# Patient Record
Sex: Female | Born: 1980 | Race: Black or African American | Hispanic: No | Marital: Single | Smoking: Never smoker
Health system: Southern US, Community
[De-identification: ages and names within clinical notes are randomized; demographics above are authoritative.]

## PROBLEM LIST (undated history)

## (undated) DIAGNOSIS — R87629 Unspecified abnormal cytological findings in specimens from vagina: Secondary | ICD-10-CM

## (undated) DIAGNOSIS — A6000 Herpesviral infection of urogenital system, unspecified: Secondary | ICD-10-CM

## (undated) DIAGNOSIS — N912 Amenorrhea, unspecified: Secondary | ICD-10-CM

## (undated) DIAGNOSIS — Z8619 Personal history of other infectious and parasitic diseases: Secondary | ICD-10-CM

## (undated) DIAGNOSIS — R7303 Prediabetes: Secondary | ICD-10-CM

## (undated) DIAGNOSIS — E7800 Pure hypercholesterolemia, unspecified: Secondary | ICD-10-CM

## (undated) HISTORY — DX: Personal history of other infectious and parasitic diseases: Z86.19

## (undated) HISTORY — DX: Herpesviral infection of urogenital system, unspecified: A60.00

## (undated) HISTORY — DX: Unspecified abnormal cytological findings in specimens from vagina: R87.629

## (undated) HISTORY — DX: Amenorrhea, unspecified: N91.2

## (undated) HISTORY — DX: Pure hypercholesterolemia, unspecified: E78.00

## (undated) HISTORY — DX: Prediabetes: R73.03

---

## 2005-05-21 ENCOUNTER — Emergency Department: Payer: Self-pay | Admitting: General Practice

## 2005-05-24 ENCOUNTER — Emergency Department: Payer: Self-pay | Admitting: Emergency Medicine

## 2005-06-13 ENCOUNTER — Emergency Department: Payer: Self-pay | Admitting: Emergency Medicine

## 2006-04-27 ENCOUNTER — Emergency Department: Payer: Self-pay | Admitting: Emergency Medicine

## 2006-05-05 ENCOUNTER — Ambulatory Visit: Payer: Self-pay | Admitting: Family Medicine

## 2006-06-20 ENCOUNTER — Emergency Department: Payer: Self-pay | Admitting: Emergency Medicine

## 2006-08-04 HISTORY — PX: TONSILLECTOMY AND ADENOIDECTOMY: SHX28

## 2006-08-18 ENCOUNTER — Ambulatory Visit: Payer: Self-pay | Admitting: Otolaryngology

## 2006-10-17 ENCOUNTER — Emergency Department: Payer: Self-pay

## 2007-05-21 ENCOUNTER — Ambulatory Visit: Payer: Self-pay | Admitting: Surgery

## 2008-05-04 ENCOUNTER — Emergency Department (HOSPITAL_BASED_OUTPATIENT_CLINIC_OR_DEPARTMENT_OTHER): Admission: EM | Admit: 2008-05-04 | Discharge: 2008-05-04 | Payer: Self-pay | Admitting: Emergency Medicine

## 2008-05-06 ENCOUNTER — Emergency Department (HOSPITAL_COMMUNITY): Admission: EM | Admit: 2008-05-06 | Discharge: 2008-05-06 | Payer: Self-pay | Admitting: Emergency Medicine

## 2008-07-03 ENCOUNTER — Emergency Department: Payer: Self-pay | Admitting: Emergency Medicine

## 2008-08-20 ENCOUNTER — Emergency Department: Payer: Self-pay | Admitting: Emergency Medicine

## 2009-02-13 ENCOUNTER — Ambulatory Visit (HOSPITAL_COMMUNITY)
Admission: RE | Admit: 2009-02-13 | Discharge: 2009-02-13 | Payer: Self-pay | Admitting: Physical Medicine and Rehabilitation

## 2012-03-22 ENCOUNTER — Emergency Department: Payer: Self-pay | Admitting: Emergency Medicine

## 2012-12-23 ENCOUNTER — Ambulatory Visit: Payer: Self-pay | Admitting: Family Medicine

## 2013-05-27 DIAGNOSIS — I2699 Other pulmonary embolism without acute cor pulmonale: Secondary | ICD-10-CM | POA: Insufficient documentation

## 2014-12-27 LAB — HM PAP SMEAR: HM PAP: NEGATIVE

## 2015-02-07 ENCOUNTER — Telehealth: Payer: Self-pay | Admitting: Obstetrics and Gynecology

## 2015-02-07 ENCOUNTER — Other Ambulatory Visit: Payer: Self-pay | Admitting: Obstetrics and Gynecology

## 2015-02-07 MED ORDER — METRONIDAZOLE 500 MG PO TABS: 500.0000 mg | ORAL_TABLET | Freq: Two times a day (BID) | ORAL | Status: DC

## 2015-02-07 NOTE — Telephone Encounter (Signed)
pls advise

## 2015-02-07 NOTE — Telephone Encounter (Signed)
PT IS REQUESTING A REFILL ON THE FLAGYL 500 MG TABLETS (SHE SAID A FEW REFILLS PLEASE)   [facility] [place]

## 2015-02-07 NOTE — Telephone Encounter (Signed)
Printed off rx

## 2015-02-07 NOTE — Telephone Encounter (Signed)
Faxed to [facility] [place] 

## 2015-02-20 ENCOUNTER — Encounter: Payer: Self-pay | Admitting: Obstetrics and Gynecology

## 2015-02-20 ENCOUNTER — Ambulatory Visit (INDEPENDENT_AMBULATORY_CARE_PROVIDER_SITE_OTHER): Payer: PRIVATE HEALTH INSURANCE | Admitting: Obstetrics and Gynecology

## 2015-02-20 VITALS — BP 109/57 | HR 86 | Ht 64.0 in | Wt 164.1 lb

## 2015-02-20 DIAGNOSIS — R102 Pelvic and perineal pain: Secondary | ICD-10-CM

## 2015-02-20 DIAGNOSIS — R300 Dysuria: Secondary | ICD-10-CM | POA: Diagnosis not present

## 2015-02-20 LAB — POCT URINALYSIS DIPSTICK
Bilirubin, UA: NEGATIVE
Glucose, UA: NEGATIVE
KETONES UA: NEGATIVE
LEUKOCYTES UA: NEGATIVE
Nitrite, UA: NEGATIVE
PH UA: 6.5
PROTEIN UA: NEGATIVE
Spec Grav, UA: 1.01
Urobilinogen, UA: 0.2

## 2015-02-20 MED ORDER — TERCONAZOLE 0.4 % VA CREA: 1.0000 | TOPICAL_CREAM | Freq: Every day | VAGINAL | Status: DC

## 2015-02-20 NOTE — Progress Notes (Signed)
Subjective:     Patient ID: Carly Bennett, female   DOB: 03-08-1981, 34 y.o.   MRN: [id number]  HPI Reports vaginal burning deep inside with increased d/c  Review of Systems Burning in vagina with occassional odor and burning with urination Green d/c resolved with metroGel- last dose yesterday; also took a diflucan last week with no improvement in s/s. Last intercourse early May.    Objective:   Physical Exam A&O x4 No apparent distress Pelvic exam: normal external genitalia, vulva, vagina, cervix, uterus and adnexa, VULVA: normal appearing vulva with no masses, tenderness or lesions, except an area of folliculitis on left majora upper margin, VAGINA: normal appearing vagina with normal color and discharge, no lesions, CERVIX: normal appearing cervix without discharge or lesions, UTERUS: uterus is normal size, shape, consistency and nontender, ADNEXA: normal adnexa in size, nontender and no masses, WET MOUNT done - results: negative for pathogens, normal epithelial cells, KOH done, vaginal pH is 6.5.    Assessment:     dysusria and vaginal pain of unknown etiology     Plan:     Urine sent for culture To d/c diflucan use, terazol & sent in for prn use Will call with results and treat accordingly.     Carly Bennett, CNM

## 2015-02-28 ENCOUNTER — Telehealth: Payer: Self-pay | Admitting: Obstetrics and Gynecology

## 2015-02-28 NOTE — Telephone Encounter (Signed)
Pt needs results from U/A, C&S from last week

## 2015-03-01 NOTE — Telephone Encounter (Signed)
Notified pt she is doing much better with medication MNB sent in

## 2015-03-21 ENCOUNTER — Telehealth: Payer: Self-pay | Admitting: Obstetrics and Gynecology

## 2015-03-21 NOTE — Telephone Encounter (Signed)
[facility] [place]

## 2015-03-21 NOTE — Telephone Encounter (Signed)
Patient needs script sent in for metronidazole and terizole vaginal cream [facility] [place].Thanks

## 2015-04-11 ENCOUNTER — Telehealth: Payer: Self-pay | Admitting: Obstetrics and Gynecology

## 2015-04-11 ENCOUNTER — Other Ambulatory Visit: Payer: Self-pay | Admitting: *Deleted

## 2015-04-11 MED ORDER — ERGOCALCIFEROL 1.25 MG (50000 UT) PO CAPS: 50000.0000 [IU] | ORAL_CAPSULE | ORAL | Status: DC

## 2015-04-11 NOTE — Telephone Encounter (Signed)
Pt called and she stated that she can not get a refill on her Vitamin D because the instructions are not written correctly she needs MNB or you to send in a new RX for her Vitamin D with the correct instructions. It stated that she should take it once a week but MNB told her to take it more then that. Not sure how many times she is to take it in a week.

## 2015-04-11 NOTE — Telephone Encounter (Signed)
rx sent to pharmacy

## 2015-09-06 ENCOUNTER — Ambulatory Visit: Payer: PRIVATE HEALTH INSURANCE | Admitting: Obstetrics and Gynecology

## 2015-10-11 ENCOUNTER — Other Ambulatory Visit: Payer: Self-pay | Admitting: Obstetrics and Gynecology

## 2015-10-11 ENCOUNTER — Telehealth: Payer: Self-pay | Admitting: Obstetrics and Gynecology

## 2015-10-11 NOTE — Telephone Encounter (Signed)
See if she can come in Friday morning or Tues morning for nurse visit schedule, debbie can work her up and I can look at swab, otherwise will have to wait till next available or see Dr D next week if he has opening.

## 2015-10-11 NOTE — Telephone Encounter (Signed)
PT CALLED AND SHE WOULD LIKE TO BE SEEN FOR VAGINITIS, THERE AR NO OPENINGS ON YOUR SCHEDULE FOR TOMORROW OR Tuesday AND TODAY YOU HAVE DELIVERY THIS AM, SO LET ME KNOW WHERE YOU THINK WE CAN PUT HER. PT IS AWARE THAT I HAD TO TALK TO YOU BEFORE SCHEDULE AND THAT I WOULD GIVE HER A CALL BACK ONCE I HEAR FROM YOU.

## 2015-10-12 NOTE — Telephone Encounter (Signed)
CALLED PT THIS MORNING AND SHE STARTED USING FORIC ACID AND SHE STATED IF IT DOESN'T HELP BY TUESDAY SHE WILL CALL US TO BE SEEN.

## 2015-11-13 ENCOUNTER — Encounter: Payer: Self-pay | Admitting: Obstetrics and Gynecology

## 2015-11-13 ENCOUNTER — Ambulatory Visit (INDEPENDENT_AMBULATORY_CARE_PROVIDER_SITE_OTHER): Payer: Managed Care, Other (non HMO) | Admitting: Obstetrics and Gynecology

## 2015-11-13 VITALS — BP 126/82 | HR 82 | Wt 168.5 lb

## 2015-11-13 DIAGNOSIS — N898 Other specified noninflammatory disorders of vagina: Secondary | ICD-10-CM | POA: Diagnosis not present

## 2015-11-13 MED ORDER — LACTINEX PO CHEW: 1.0000 | CHEWABLE_TABLET | Freq: Three times a day (TID) | ORAL | Status: DC

## 2015-11-13 NOTE — Patient Instructions (Signed)
Lactobacillus Oral formulations What is this medicine? LACTOBACILLUS (lak toh buh SIL uhs) is a supplement. It is used to help the normal balance of bacteria in the colon. This may treat or prevent diarrhea caused by an infection or by antibiotics. The FDA has not approved this supplement for any medical use. This supplement may be used for other purposes; ask your health care provider or pharmacist if you have questions. This medicine may be used for other purposes; ask your health care provider or pharmacist if you have questions. What should I tell my health care provider before I take this medicine? They need to know if you have any of these conditions: -chronic disease -immune system problems -prosthetic heart valve or valvular heart disease -an unusual or allergic reaction to Lactobacillus, any medicines, lactose or milk, other foods, dyes, or preservatives -pregnant or trying to get pregnant -breast-feeding How should I use this medicine? Take this medicine by mouth with a small amount of milk, fruit juice, or water. Follow the directions on the package labeling, or take as directed by your health care professional. This medicine can be taken with cereal or other food. Do not take this medicine more often than directed. Contact your pediatrician regarding the use of this medicine in children. Special care may be needed. This medicine is not recommended for children under 82 years old unless prescribed by a doctor. Overdosage: If you think you have taken too much of this medicine contact a poison control center or emergency room at once. NOTE: This medicine is only for you. Do not share this medicine with others. What if I miss a dose? If you miss a dose, take it as soon as you can. If it is almost time for your next dose, take only that dose. Do not take double or extra doses. What may interact with this medicine? Interactions are not expected. This list may not describe all possible  interactions. Give your health care provider a list of all the medicines, herbs, non-prescription drugs, or dietary supplements you use. Also tell them if you smoke, drink alcohol, or use illegal drugs. Some items may interact with your medicine. What should I watch for while using this medicine? See your doctor if your symptoms do not get better or if they get worse. Do not take this supplement for more than 2 days or if you have a fever unless your doctor tells you to. If you have allergies to milk or you are sensitive to lactose, do not use this supplement. What side effects may I notice from receiving this medicine? Side effects that you should report to your doctor or health care professional as soon as possible: -allergic reactions like skin rash, itching or hives, swelling of the face, lips, or tongue -breathing problems -severe nausea, vomiting -unusually weak or tired Side effects that usually do not require medical attention (report to your doctor or health care professional if they continue or are bothersome): -hiccups -stomach gas This list may not describe all possible side effects. Call your doctor for medical advice about side effects. You may report side effects to FDA at 1-800-FDA-1088. Where should I keep my medicine? Keep out of the reach of children. Store in the refrigerator or as directed on the package label. Do not freeze. Throw away any unused medicine after the expiration date. NOTE: This sheet is a summary. It may not cover all possible information. If you have questions about this medicine, talk to your doctor, pharmacist, or health  care provider.    2016, Elsevier/Gold Standard. (2011-07-18 07:41:13)  

## 2015-11-13 NOTE — Progress Notes (Signed)
Subjective:     Patient ID: Carly Bennett, female   DOB: June 11, 1981, 35 y.o.   MRN: [id number]  HPI Reports having to take antibiotics for UTI in February and self treated for yeast afterwards, then treated for recurrent BV and BV prevention with a combination of boric acid/coconut suppositories that she places vaginally as needed. Feels slight irritation and wanted to have things checked out. Last used suppositories 3 days ago. Is due for menses any time. Has not been sexually active since Oct.    Review of Systems See above    Objective:   Physical Exam A&Ox4  Filed Vitals:   11/13/15 1405  Weight: 168 lb 8 oz (76.431 kg)  Pelvic exam: normal external genitalia, vulva, vagina, cervix, uterus and adnexa, WET MOUNT done - results: negative for pathogens, normal epithelial cells, pH 4.5. No lactobacilla noted    Assessment:     Non-infectious leukorrhea, low lactobacilli levels     Plan:     Recommend crushing lactobacilla chews and placing vaginally prn and/or taking orally.  RTC prn  Melody [place], CNM

## 2015-11-22 ENCOUNTER — Telehealth: Payer: Self-pay | Admitting: Obstetrics and Gynecology

## 2015-11-22 ENCOUNTER — Other Ambulatory Visit: Payer: Self-pay | Admitting: Obstetrics and Gynecology

## 2015-11-22 MED ORDER — TINIDAZOLE 500 MG PO TABS: 500.0000 mg | ORAL_TABLET | Freq: Two times a day (BID) | ORAL | Status: DC

## 2015-11-22 NOTE — Telephone Encounter (Signed)
rx sent in 

## 2015-11-22 NOTE — Telephone Encounter (Signed)
Is this ok?

## 2015-11-22 NOTE — Telephone Encounter (Signed)
Patient feels that Tinidazole worked better than flagyl and would like a script for it instead. [facility] [place].Thanks

## 2015-11-22 NOTE — Telephone Encounter (Signed)
Notified pt. 

## 2016-01-01 ENCOUNTER — Ambulatory Visit (INDEPENDENT_AMBULATORY_CARE_PROVIDER_SITE_OTHER): Payer: No Typology Code available for payment source | Admitting: Obstetrics and Gynecology

## 2016-01-01 ENCOUNTER — Encounter: Payer: Self-pay | Admitting: Obstetrics and Gynecology

## 2016-01-01 ENCOUNTER — Encounter: Payer: Self-pay | Admitting: *Deleted

## 2016-01-01 ENCOUNTER — Other Ambulatory Visit: Payer: Self-pay | Admitting: Obstetrics and Gynecology

## 2016-01-01 VITALS — BP 110/70 | HR 80 | Ht 64.0 in | Wt 169.2 lb

## 2016-01-01 DIAGNOSIS — E559 Vitamin D deficiency, unspecified: Secondary | ICD-10-CM | POA: Diagnosis not present

## 2016-01-01 DIAGNOSIS — Z0000 Encounter for general adult medical examination without abnormal findings: Secondary | ICD-10-CM

## 2016-01-01 DIAGNOSIS — Z01419 Encounter for gynecological examination (general) (routine) without abnormal findings: Secondary | ICD-10-CM

## 2016-01-01 NOTE — Progress Notes (Signed)
Subjective:   Carly ShaggyKristina S Bennett is a 35 y.o. G2P0 African American female here for a routine well-woman exam.  Patient's last menstrual period was 12/14/2015.    Current complaints: recurrent vaginitis PCP: ?       does desire labs  Social History: Sexual: not sexually active Marital Status: single Living situation: alone Occupation: unknown occupation Tobacco/alcohol: no tobacco use Illicit drugs: no history of illicit drug use  The following portions of the patient's history were reviewed and updated as appropriate: allergies, current medications, past family history, past medical history, past social history, past surgical history and problem list.  Past Medical History Past Medical History  Diagnosis Date  . History of bacterial infection   . Amenorrhea   . Herpes genitalis   . Vaginal Pap smear, abnormal   . Elevated cholesterol   . Pre-diabetes     Past Surgical History Past Surgical History  Procedure Laterality Date  . Tonsillectomy and adenoidectomy  2008    Gynecologic History G2P0  Patient's last menstrual period was 12/14/2015. Contraception: abstinence Last Pap: 2014. Results were: normal   Obstetric History OB History  Gravida Para Term Preterm AB SAB TAB Ectopic Multiple Living  2         2    # Outcome Date GA Lbr Len/2nd Weight Sex Delivery Anes PTL Lv  2 Gravida 2000    F Vag-Spont   Y  1 Gravida 1998    M Vag-Spont   Y      Current Medications Current Outpatient Prescriptions on File Prior to Visit  Medication Sig Dispense Refill  . ergocalciferol (VITAMIN D2) 50000 UNITS capsule Take 1 capsule (50,000 Units total) by mouth 2 (two) times a week. 24 capsule 3  . Boric Acid POWD by Does not apply route. Reported on 01/01/2016    . lactobacillus acidophilus & bulgar (LACTINEX) chewable tablet Chew 1 tablet by mouth 3 (three) times daily with meals. 30 tablet 2  . metroNIDAZOLE (FLAGYL) 500 MG tablet Take 1 tablet (500 mg total) by mouth 2 (two)  times daily. (Patient not taking: Reported on 11/13/2015) 14 tablet 2  . terconazole (TERAZOL 7) 0.4 % vaginal cream Place 1 applicator vaginally at bedtime. (Patient not taking: Reported on 01/01/2016) 45 g 2  . tinidazole (TINDAMAX) 500 MG tablet Take 1 tablet (500 mg total) by mouth 2 (two) times daily. (Patient not taking: Reported on 01/01/2016) 10 tablet 4   No current facility-administered medications on file prior to visit.    Review of Systems Patient denies any headaches, blurred vision, shortness of breath, chest pain, abdominal pain, problems with bowel movements, urination, or intercourse.  Objective:  BP 110/70 mmHg  Pulse 80  Ht 5\' 4"  (1.626 m)  Wt 169 lb 3.2 oz (76.749 kg)  BMI 29.03 kg/m2  LMP 12/14/2015 Physical Exam  General:  Well developed, well nourished, no acute distress. She is alert and oriented x3. Skin:  Warm and dry Neck:  Midline trachea, no thyromegaly or nodules Cardiovascular: Regular rate and rhythm, no murmur heard Lungs:  Effort normal, all lung fields clear to auscultation bilaterally Breasts:  No dominant palpable mass, retraction, or nipple discharge Abdomen:  Soft, non tender, no hepatosplenomegaly or masses Pelvic:  External genitalia is normal in appearance.  The vagina is normal in appearance. The cervix is bulbous, no CMT.  Thin prep pap is done with HR HPV cotesting. Uterus is felt to be normal size, shape, and contour.  No adnexal masses or  tenderness noted.  Extremities:  No swelling or varicosities noted Psych:  She has a normal mood and affect  Assessment:   Healthy well-woman exam Vit D Deficiency BV-recurrent Plan:  Reviewed patients current self treatment of BV. No changes at this time. F/U 1 year  for AE, or sooner if needed  Raeshaun Simson Suzan Nailer, CNM

## 2016-01-01 NOTE — Patient Instructions (Signed)
  Place annual gynecologic exam patient instructions here.  Thank you for enrolling in MyChart. Please follow the instructions below to securely access your online medical record. MyChart allows you to send messages to your doctor, view your test results, manage appointments, and more.   How Do I Sign Up? 1. In your Internet browser, go to [employer] Address [employer] and enter [email or web address]. 2. Click on the Sign Up Now link in the Sign In box. You will see the New Member Sign Up page. 3. Enter your MyChart Access Code exactly as it appears below. You will not need to use this code after you've completed the sign-up process. If you do not sign up before the expiration date, you must request a new code.  MyChart Access Code: [email or web address] Expires: 01/12/2016  8:25 AM  4. Enter your Social Security Number ([id number]) and Date of Birth (mm/dd/yyyy) as indicated and click Submit. You will be taken to the next sign-up page. 5. Create a MyChart ID. This will be your MyChart login ID and cannot be changed, so think of one that is secure and easy to remember. 6. Create a MyChart password. You can change your password at any time. 7. Enter your Password Reset Question and Answer. This can be used at a later time if you forget your password.  8. Enter your e-mail address. You will receive e-mail notification when new information is available in MyChart. 9. Click Sign Up. You can now view your medical record.   Additional Information Remember, MyChart is NOT to be used for urgent needs. For medical emergencies, dial 911.

## 2016-01-02 ENCOUNTER — Other Ambulatory Visit: Payer: No Typology Code available for payment source

## 2016-01-03 LAB — CYTOLOGY - PAP

## 2016-01-04 ENCOUNTER — Telehealth: Payer: Self-pay | Admitting: Obstetrics and Gynecology

## 2016-01-04 LAB — RPR: RPR: REACTIVE — AB

## 2016-01-04 LAB — LIPID PANEL
CHOL/HDL RATIO: 3.8 ratio (ref 0.0–4.4)
Cholesterol, Total: 213 mg/dL — ABNORMAL HIGH (ref 100–199)
HDL: 56 mg/dL (ref >39)
LDL Calculated: 131 mg/dL — ABNORMAL HIGH (ref 0–99)
Triglycerides: 129 mg/dL (ref 0–149)
VLDL CHOLESTEROL CAL: 26 mg/dL (ref 5–40)

## 2016-01-04 LAB — COMPREHENSIVE METABOLIC PANEL
ALBUMIN: 4.5 g/dL (ref 3.5–5.5)
ALK PHOS: 48 IU/L (ref 39–117)
ALT: 19 IU/L (ref 0–32)
AST: 23 IU/L (ref 0–40)
Albumin/Globulin Ratio: 1.3 (ref 1.2–2.2)
BILIRUBIN TOTAL: 0.3 mg/dL (ref 0.0–1.2)
BUN/Creatinine Ratio: 13 (ref 9–23)
BUN: 11 mg/dL (ref 6–20)
CHLORIDE: 100 mmol/L (ref 96–106)
CO2: 25 mmol/L (ref 18–29)
CREATININE: 0.82 mg/dL (ref 0.57–1.00)
Calcium: 10.1 mg/dL (ref 8.7–10.2)
GFR calc Af Amer: 106 mL/min/{1.73_m2} (ref >59)
GFR calc non Af Amer: 92 mL/min/{1.73_m2} (ref >59)
GLUCOSE: 89 mg/dL (ref 65–99)
Globulin, Total: 3.4 g/dL (ref 1.5–4.5)
Potassium: 4.6 mmol/L (ref 3.5–5.2)
Sodium: 142 mmol/L (ref 134–144)
Total Protein: 7.9 g/dL (ref 6.0–8.5)

## 2016-01-04 LAB — HEMOGLOBIN A1C
ESTIMATED AVERAGE GLUCOSE: 123 mg/dL
HEMOGLOBIN A1C: 5.9 % — AB (ref 4.8–5.6)

## 2016-01-04 LAB — RPR, QUANT+TP ABS (REFLEX)
Rapid Plasma Reagin, Quant: 1:1 {titer} — ABNORMAL HIGH
TREPONEMA PALLIDUM AB: NEGATIVE

## 2016-01-04 LAB — HEPATITIS C ANTIBODY: Hep C Virus Ab: <0.1 s/co ratio (ref 0.0–0.9)

## 2016-01-04 LAB — HIV ANTIBODY (ROUTINE TESTING W REFLEX): HIV SCREEN 4TH GENERATION: NONREACTIVE

## 2016-01-04 LAB — VITAMIN D 25 HYDROXY (VIT D DEFICIENCY, FRACTURES): VIT D 25 HYDROXY: 58.7 ng/mL (ref 30.0–100.0)

## 2016-01-04 NOTE — Telephone Encounter (Signed)
Ms. Carly Bennett called to receive her lab results. She'd like a phone call regarding this and said a detailed message CAN be left on her voice mail if she's unable to answer because she's very curious about certain things that were drawn.  Pt's ph# [phone] Thank you.

## 2016-01-04 NOTE — Telephone Encounter (Signed)
pls advise

## 2016-01-07 ENCOUNTER — Telehealth: Payer: Self-pay | Admitting: *Deleted

## 2016-01-07 NOTE — Telephone Encounter (Signed)
Pt notified of lab results. Pt would like to know if she should do a 3 month f/u. Okay to respond on my chart.

## 2016-01-07 NOTE — Telephone Encounter (Signed)
Patient is calling to inquire about her lab results . Patients was instructed to sign up for Mychart. Her labs is not posted yet on Mychart. Patient call back number is [phone]

## 2016-01-08 ENCOUNTER — Other Ambulatory Visit: Payer: Self-pay | Admitting: Obstetrics and Gynecology

## 2016-01-08 NOTE — Telephone Encounter (Signed)
Left pt message that Melody sent her a message on My Chart.

## 2016-01-08 NOTE — Telephone Encounter (Signed)
Please let her know I sent a message via mychart

## 2016-04-25 ENCOUNTER — Telehealth: Payer: Self-pay | Admitting: Obstetrics and Gynecology

## 2016-04-25 NOTE — Telephone Encounter (Signed)
Pt to urgent care and [facility] and ruled out a UA, so she said she has  bacterial vaginosis and needs refill on Flagyl... [facility] [address]

## 2016-04-28 ENCOUNTER — Other Ambulatory Visit: Payer: Self-pay | Admitting: *Deleted

## 2016-04-28 MED ORDER — METRONIDAZOLE 500 MG PO TABS: 500.0000 mg | ORAL_TABLET | Freq: Two times a day (BID) | ORAL | 2 refills | Status: DC

## 2016-04-28 NOTE — Telephone Encounter (Signed)
Done-ac 

## 2016-05-08 ENCOUNTER — Ambulatory Visit (INDEPENDENT_AMBULATORY_CARE_PROVIDER_SITE_OTHER): Payer: No Typology Code available for payment source | Admitting: Obstetrics and Gynecology

## 2016-05-08 ENCOUNTER — Encounter: Payer: Self-pay | Admitting: Obstetrics and Gynecology

## 2016-05-08 ENCOUNTER — Ambulatory Visit: Payer: No Typology Code available for payment source | Admitting: Obstetrics and Gynecology

## 2016-05-08 VITALS — BP 108/89 | HR 82 | Wt 167.2 lb

## 2016-05-08 DIAGNOSIS — N898 Other specified noninflammatory disorders of vagina: Secondary | ICD-10-CM

## 2016-05-08 DIAGNOSIS — N760 Acute vaginitis: Secondary | ICD-10-CM

## 2016-05-08 DIAGNOSIS — Z87440 Personal history of urinary (tract) infections: Secondary | ICD-10-CM

## 2016-05-08 NOTE — Progress Notes (Signed)
GYNECOLOGY PROGRESS NOTE  Subjective:    Patient ID: Carly Bennett, female    DOB: [birth year], 35 y.o.   MRN: [id number]  HPI  Patient is a 35 y.o. G67P0 female who presents as a referral from [facility] ([facility]) for recurrent vaginitis.  Patient notes that she has had intense burning and odor.  Has a h/o chronic BV and yeast, and has been treated several times with Flagyl, Diflucan, vaginal creams, boric acid.  Has been on suppressive therapy in the past, but notes the symptoms return. Also reports h/o GBS UTI x 2 in the past several  months.  Patient notes that she is using Refresh vaginal moisturizers, and has even used betadine to clean the vaginal area.  Has been using probiotics, changing diet (decreasing sweets, consuming yogurt).  Notes changing pantyliners several times per day for hygiene, or sometimes changes her underwear entirely.   The following portions of the patient's history were reviewed and updated as appropriate: allergies, current medications, past family history, past medical history, past social history, past surgical history and problem list.  Review of Systems Pertinent items noted in HPI and remainder of comprehensive ROS otherwise negative.   Objective:   Blood pressure 108/89, pulse 82, weight 167 lb 3.2 oz (75.8 kg). General appearance: alert and no distress Skin: no sores or suspicious lesions or rashes or color changes Pelvic: external genitalia normal, rectovaginal septum normal.  Vagina with moderate mucoid cervical discharge. Minimal thin white discharge present.  No vaginal odor. Cervix normal appearing, no lesions and no motion tenderness.  Uterus mobile, nontender, normal shape and size.  Adnexae non-palpable, nontender bilaterally.  Extremities: extremities normal, atraumatic, no cyanosis or edema Neurologic: Grossly normal   Assessment:   Recurrent vaginitis H/o UTI Vaginal odor  Plan:   - Discussed with patient that she may be  over-cleansing and over-treating the vaginal area, leading to irritation.  No odor noted today on exam. Reports that she feels as though she often smells it, however no one else has seemed to notice it.  Discussed that she may be detecting her own pheromones and body odor.  Patient also with h/o hydradenitis suppurativa in the groin area, had tract surgically removed.  Noted that patient may just have an overworking glands in the vaginal and vulvar area (also noted by moderate mucoid cervical discharge).  Discussed use of corn starch powder, can consider use of OCPs to regulate hormones.  Will also order NuSwab to r/o atypical causes of vaginitis.  Discouraged continued use of betadine, and to use gentle pH-balanced soaps.   - H/o UTI - patient notes that she does not consume as much water as she should.  Advised on increasing water intake, cranberry juice.   - Will notify patient by phone of results, and discuss further management.  If no response to previously mentioned management options, can consider colposcopy to assess for vaginal pathology.  All questions answered.    A total of 25 minutes were spent face-to-face with the patient during this encounter and over half of that time involved counseling and coordination of care.   Hildred Laser, MD [facility]

## 2016-05-13 LAB — BACTERIAL VAGINOSIS, NAA

## 2016-05-13 LAB — CANDIDA 6 SPECIES PROFILE, NAA
CANDIDA ALBICANS, NAA: NEGATIVE
CANDIDA GLABRATA, NAA: NEGATIVE
CANDIDA LUSITANIAE, NAA: NEGATIVE
CANDIDA TROPICALIS, NAA: NEGATIVE
Candida krusei, NAA: NEGATIVE
Candida parapsilosis, NAA: NEGATIVE

## 2016-05-16 ENCOUNTER — Telehealth: Payer: Self-pay | Admitting: Obstetrics and Gynecology

## 2016-05-16 NOTE — Telephone Encounter (Signed)
She said never mind [facility]

## 2016-05-16 NOTE — Telephone Encounter (Signed)
Pt is calling about results. I think Melody did the RPR test and she was wondering if the results are back. She does have mychart but I think that one test is abnormal.

## 2016-05-20 LAB — GENITAL MYCOPLASMAS NAA, SWAB
Mycoplasma genitalium NAA: NEGATIVE
Mycoplasma hominis NAA: NEGATIVE
UREAPLASMA SPP NAA: NEGATIVE

## 2016-05-20 LAB — SPECIMEN STATUS REPORT

## 2016-09-02 ENCOUNTER — Telehealth: Payer: Self-pay | Admitting: Obstetrics and Gynecology

## 2016-09-02 ENCOUNTER — Other Ambulatory Visit: Payer: Self-pay | Admitting: *Deleted

## 2016-09-02 MED ORDER — METRONIDAZOLE 0.75 % VA GEL: 1.0000 | Freq: Every day | VAGINAL | 4 refills | Status: DC

## 2016-09-02 NOTE — Telephone Encounter (Signed)
Patient called [facility] a [facility] [address]. She would also like a call back before you send it in, she has a few questions. Thanks

## 2016-09-03 NOTE — Telephone Encounter (Signed)
Refilled pt metrogel- pt requested the cream this time

## 2016-09-19 ENCOUNTER — Encounter: Payer: Self-pay | Admitting: Certified Nurse Midwife

## 2016-09-19 ENCOUNTER — Ambulatory Visit (INDEPENDENT_AMBULATORY_CARE_PROVIDER_SITE_OTHER): Payer: BLUE CROSS/BLUE SHIELD | Admitting: Certified Nurse Midwife

## 2016-09-19 VITALS — BP 100/55 | HR 75 | Ht 64.0 in | Wt 162.9 lb

## 2016-09-19 DIAGNOSIS — R102 Pelvic and perineal pain: Secondary | ICD-10-CM

## 2016-09-19 MED ORDER — METRONIDAZOLE 500 MG PO TABS: 500.0000 mg | ORAL_TABLET | Freq: Two times a day (BID) | ORAL | 2 refills | Status: DC

## 2016-09-19 NOTE — Patient Instructions (Signed)

## 2016-09-19 NOTE — Progress Notes (Signed)
  Subjective:     Carly Bennett is a 36 y.o. female here for a evaluation of vaginal pain.  Personal health questionnaire reviewed: yes.   Gynecologic History Patient's last menstrual period was 09/01/2016 (approximate). Contraception: abstinence Last Pap: 01/01/16 Results were: normal Last mammogram: N/A. Results were: N/A  Obstetric History OB History  Gravida Para Term Preterm AB Living  2         2  SAB TAB Ectopic Multiple Live Births          2    # Outcome Date GA Lbr Len/2nd Weight Sex Delivery Anes PTL Lv  2 Gravida 2000    F Vag-Spont   LIV  1 Gravida 1998    M Vag-Spont   LIV       The following portions of the patient's history were reviewed and updated as appropriate: allergies, current medications, past social history and problem list.  Review of Systems A comprehensive review of systems was negative except for: Genitourinary: positive for vaginal pain  Carly Bennett states that the vaginal pain is what  she experiences with bacterial vaginosis. Denies new partners, states that she has not been sexually active since August. Declines STD testing.    Objective:    BP (!) 100/55   Pulse 75   Ht 5\' 4"  (1.626 m)   Wt 162 lb 14.4 oz (73.9 kg)   LMP 09/01/2016 (Approximate)   BMI 27.96 kg/m   General Appearance:    Alert, cooperative, no distress, appears stated age  Head:    Normocephalic, without obvious abnormality, atraumatic  Eyes:    Ears:    Nose:   Throat:   Neck:   Back:     Lungs:     respirations unlabored  Chest Wall:     Heart:    Breast Exam:    Abdomen:     Genitalia:    Normal female without lesion,Vagina and vulva are normal; clear/white discharge is noted with no odor.  Cervix normal without lesions. Exam chaperoned by female assistant.       Assessment:    Healthy female exam.   Wet Prep: few clue cells, no yeast buds or psedohyphae    Plan:        Flagyl 500 mg PO BID for 7 days Follow up as needed if symptoms do not improve.

## 2017-02-17 ENCOUNTER — Telehealth: Payer: Self-pay | Admitting: Obstetrics and Gynecology

## 2017-02-17 NOTE — Telephone Encounter (Signed)
pls advise

## 2017-02-17 NOTE — Telephone Encounter (Signed)
Patient needs refill on metronidazole tablets - she is taking her last of that script now and had to RS her AE appt to August   If you have had anything new come available for the VB problem she is open to try it.  Please call and let her know   [facility] [address] [place]

## 2017-02-18 ENCOUNTER — Other Ambulatory Visit: Payer: Self-pay | Admitting: Obstetrics and Gynecology

## 2017-02-18 DIAGNOSIS — R102 Pelvic and perineal pain: Secondary | ICD-10-CM

## 2017-02-18 MED ORDER — METRONIDAZOLE 500 MG PO TABS: 500.0000 mg | ORAL_TABLET | Freq: Two times a day (BID) | ORAL | 2 refills | Status: DC

## 2017-02-18 NOTE — Telephone Encounter (Signed)
done

## 2017-02-19 ENCOUNTER — Encounter: Payer: BLUE CROSS/BLUE SHIELD | Admitting: Obstetrics and Gynecology

## 2017-03-25 ENCOUNTER — Ambulatory Visit (INDEPENDENT_AMBULATORY_CARE_PROVIDER_SITE_OTHER): Payer: BLUE CROSS/BLUE SHIELD | Admitting: Obstetrics and Gynecology

## 2017-03-25 ENCOUNTER — Encounter: Payer: Self-pay | Admitting: Obstetrics and Gynecology

## 2017-03-25 ENCOUNTER — Other Ambulatory Visit: Payer: Self-pay | Admitting: Obstetrics and Gynecology

## 2017-03-25 VITALS — BP 118/72 | HR 69 | Ht 64.0 in | Wt 166.0 lb

## 2017-03-25 DIAGNOSIS — Z01419 Encounter for gynecological examination (general) (routine) without abnormal findings: Secondary | ICD-10-CM | POA: Diagnosis not present

## 2017-03-25 LAB — HM PAP SMEAR: HM Pap smear: NORMAL

## 2017-03-25 NOTE — Progress Notes (Signed)
Subjective:   Carly Bennett is a 36 y.o. G2P0 African American female here for a routine well-woman exam.  Patient's last menstrual period was 03/07/2017.    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 Tobacco/alcohol: no tobacco use Illicit drugs: no history of illicit drug use  The following portions of the patient's history were reviewed and updated as appropriate: allergies, current medications, past family history, past medical history, past social history, past surgical history and problem list.  Past Medical History Past Medical History:  Diagnosis Date  . Amenorrhea   . Elevated cholesterol   . Herpes genitalis   . History of bacterial infection   . Pre-diabetes   . Vaginal Pap smear, abnormal     Past Surgical History Past Surgical History:  Procedure Laterality Date  . TONSILLECTOMY AND ADENOIDECTOMY  2008    Gynecologic History G2P0  Patient's last menstrual period was 03/07/2017. Contraception: abstinence Last Pap: 2017. Results were: normal   Obstetric History OB History  Gravida Para Term Preterm AB Living  2         2  SAB TAB Ectopic Multiple Live Births          2    # Outcome Date GA Lbr Len/2nd Weight Sex Delivery Anes PTL Lv  2 Gravida 2000    F Vag-Spont   LIV  1 Gravida 1998    M Vag-Spont   LIV      Current Medications Current Outpatient Prescriptions on File Prior to Visit  Medication Sig Dispense Refill  . lactobacillus acidophilus & bulgar (LACTINEX) chewable tablet Chew 1 tablet by mouth 3 (three) times daily with meals. (Patient not taking: Reported on 03/25/2017) 30 tablet 2  . metroNIDAZOLE (FLAGYL) 500 MG tablet Take 1 tablet (500 mg total) by mouth 2 (two) times daily. (Patient not taking: Reported on 03/25/2017) 30 tablet 2  . metroNIDAZOLE (METROGEL VAGINAL) 0.75 %  vaginal gel Place 1 Applicatorful vaginally at bedtime. (Patient not taking: Reported on 03/25/2017) 45 g 4   No current facility-administered medications on file prior to visit.     Review of Systems Patient denies any headaches, blurred vision, shortness of breath, chest pain, abdominal pain, problems with bowel movements, urination, or intercourse.  Objective:  BP 118/72   Pulse 69   Ht 5\' 4"  (1.626 m)   Wt 166 lb (75.3 kg)   LMP 03/07/2017   BMI 28.49 kg/m  Physical Exam  General:  Well developed, well nourished, no acute distress. She is alert and oriented x3. Skin:  Warm and dry Neck:  Midline trachea, no thyromegaly or nodules Cardiovascular: Regular rate and rhythm, no murmur heard Lungs:  Effort normal, all lung fields clear to auscultation bilaterally Breasts:  No dominant palpable mass, retraction, or nipple discharge Abdomen:  Soft, non tender, no hepatosplenomegaly or masses Pelvic:  External genitalia is normal in appearance.  The vagina is normal in appearance. The cervix is bulbous, no CMT.  Thin prep pap is not done . Uterus is felt to be normal size, shape, and contour.  No adnexal masses or tenderness noted. Extremities:  No swelling or varicosities noted Psych:  She has a normal mood and affect Microscopic wet-mount exam shows negative for pathogens, normal epithelial cells, pH < 4.5. Assessment:   Healthy well-woman exam BV, H/O  Plan:  Labs  obtained will follow up accordingly F/U 1 year for AE, or sooner if needed   Melody Suzan Nailer, CNM

## 2017-03-25 NOTE — Patient Instructions (Signed)
Preventive Care 18-39 Years, Female Preventive care refers to lifestyle choices and visits with your health care provider that can promote health and wellness. What does preventive care include?  A yearly physical exam. This is also called an annual well check.  Dental exams once or twice a year.  Routine eye exams. Ask your health care provider how often you should have your eyes checked.  Personal lifestyle choices, including: ? Daily care of your teeth and gums. ? Regular physical activity. ? Eating a healthy diet. ? Avoiding tobacco and drug use. ? Limiting alcohol use. ? Practicing safe sex. ? Taking vitamin and mineral supplements as recommended by your health care provider. What happens during an annual well check? The services and screenings done by your health care provider during your annual well check will depend on your age, overall health, lifestyle risk factors, and family history of disease. Counseling Your health care provider may ask you questions about your:  Alcohol use.  Tobacco use.  Drug use.  Emotional well-being.  Home and relationship well-being.  Sexual activity.  Eating habits.  Work and work Statistician.  Method of birth control.  Menstrual cycle.  Pregnancy history.  Screening You may have the following tests or measurements:  Height, weight, and BMI.  Diabetes screening. This is done by checking your blood sugar (glucose) after you have not eaten for a while (fasting).  Blood pressure.  Lipid and cholesterol levels. These may be checked every 5 years starting at age 38.  Skin check.  Hepatitis C blood test.  Hepatitis B blood test.  Sexually transmitted disease (STD) testing.  BRCA-related cancer screening. This may be done if you have a family history of breast, ovarian, tubal, or peritoneal cancers.  Pelvic exam and Pap test. This may be done every 3 years starting at age 38. Starting at age 30, this may be done  every 5 years if you have a Pap test in combination with an HPV test.  Discuss your test results, treatment options, and if necessary, the need for more tests with your health care provider. Vaccines Your health care provider may recommend certain vaccines, such as:  Influenza vaccine. This is recommended every year.  Tetanus, diphtheria, and acellular pertussis (Tdap, Td) vaccine. You may need a Td booster every 10 years.  Varicella vaccine. You may need this if you have not been vaccinated.  HPV vaccine. If you are 39 or younger, you may need three doses over 6 months.  Measles, mumps, and rubella (MMR) vaccine. You may need at least one dose of MMR. You may also need a second dose.  Pneumococcal 13-valent conjugate (PCV13) vaccine. You may need this if you have certain conditions and were not previously vaccinated.  Pneumococcal polysaccharide (PPSV23) vaccine. You may need one or two doses if you smoke cigarettes or if you have certain conditions.  Meningococcal vaccine. One dose is recommended if you are age 68-21 years and a first-year college student living in a residence hall, or if you have one of several medical conditions. You may also need additional booster doses.  Hepatitis A vaccine. You may need this if you have certain conditions or if you travel or work in places where you may be exposed to hepatitis A.  Hepatitis B vaccine. You may need this if you have certain conditions or if you travel or work in places where you may be exposed to hepatitis B.  Haemophilus influenzae type b (Hib) vaccine. You may need this  if you have certain risk factors.  Talk to your health care provider about which screenings and vaccines you need and how often you need them. This information is not intended to replace advice given to you by your health care provider. Make sure you discuss any questions you have with your health care provider. Document Released: 09/16/2001 Document Revised:  04/09/2016 Document Reviewed: 05/22/2015 Elsevier Interactive Patient Education  2017 Elsevier Inc.  

## 2017-03-26 LAB — COMPREHENSIVE METABOLIC PANEL
ALBUMIN: 4.8 g/dL (ref 3.5–5.5)
ALK PHOS: 49 IU/L (ref 39–117)
ALT: 22 IU/L (ref 0–32)
AST: 23 IU/L (ref 0–40)
Albumin/Globulin Ratio: 1.5 (ref 1.2–2.2)
BUN / CREAT RATIO: 15 (ref 9–23)
BUN: 15 mg/dL (ref 6–20)
Bilirubin Total: 0.3 mg/dL (ref 0.0–1.2)
CO2: 24 mmol/L (ref 20–29)
CREATININE: 0.98 mg/dL (ref 0.57–1.00)
Calcium: 10.4 mg/dL — ABNORMAL HIGH (ref 8.7–10.2)
Chloride: 100 mmol/L (ref 96–106)
GFR calc non Af Amer: 73 mL/min/{1.73_m2} (ref >59)
GFR, EST AFRICAN AMERICAN: 85 mL/min/{1.73_m2} (ref >59)
GLOBULIN, TOTAL: 3.2 g/dL (ref 1.5–4.5)
GLUCOSE: 88 mg/dL (ref 65–99)
Potassium: 4.3 mmol/L (ref 3.5–5.2)
SODIUM: 139 mmol/L (ref 134–144)
TOTAL PROTEIN: 8 g/dL (ref 6.0–8.5)

## 2017-03-26 LAB — LIPID PANEL
CHOLESTEROL TOTAL: 207 mg/dL — AB (ref 100–199)
Chol/HDL Ratio: 3.1 ratio (ref 0.0–4.4)
HDL: 66 mg/dL (ref >39)
LDL Calculated: 125 mg/dL — ABNORMAL HIGH (ref 0–99)
Triglycerides: 81 mg/dL (ref 0–149)
VLDL CHOLESTEROL CAL: 16 mg/dL (ref 5–40)

## 2017-03-26 LAB — RPR: RPR Ser Ql: NONREACTIVE

## 2017-03-26 LAB — CYTOLOGY - PAP

## 2017-03-26 LAB — VITAMIN D 25 HYDROXY (VIT D DEFICIENCY, FRACTURES): Vit D, 25-Hydroxy: 36.2 ng/mL (ref 30.0–100.0)

## 2017-03-26 LAB — HSV 1 AND 2 IGM ABS, INDIRECT

## 2017-03-26 LAB — HIV ANTIBODY (ROUTINE TESTING W REFLEX): HIV Screen 4th Generation wRfx: NONREACTIVE

## 2017-03-26 LAB — TSH: TSH: 1.68 u[IU]/mL (ref 0.450–4.500)

## 2017-03-30 ENCOUNTER — Encounter: Payer: Self-pay | Admitting: *Deleted

## 2017-03-31 ENCOUNTER — Telehealth: Payer: Self-pay | Admitting: Obstetrics and Gynecology

## 2017-03-31 NOTE — Telephone Encounter (Signed)
pls advise

## 2017-03-31 NOTE — Telephone Encounter (Signed)
Pt is requesting a nurse return her call to give her labs and PAP results. Please advise. Thanks TNP

## 2017-04-01 ENCOUNTER — Encounter: Payer: Self-pay | Admitting: Obstetrics and Gynecology

## 2017-04-01 NOTE — Telephone Encounter (Signed)
I sent message via MyChart

## 2017-04-03 ENCOUNTER — Encounter: Payer: BLUE CROSS/BLUE SHIELD | Admitting: Obstetrics and Gynecology

## 2017-04-13 ENCOUNTER — Ambulatory Visit
Admission: RE | Admit: 2017-04-13 | Discharge: 2017-04-13 | Disposition: A | Discharge status: Home / Self Care | Payer: BLUE CROSS/BLUE SHIELD | Source: Ambulatory Visit | Attending: Obstetrics and Gynecology | Admitting: Obstetrics and Gynecology

## 2017-04-13 DIAGNOSIS — Z1231 Encounter for screening mammogram for malignant neoplasm of breast: Secondary | ICD-10-CM | POA: Insufficient documentation

## 2017-04-13 DIAGNOSIS — Z01419 Encounter for gynecological examination (general) (routine) without abnormal findings: Secondary | ICD-10-CM

## 2017-05-14 ENCOUNTER — Telehealth: Payer: Self-pay | Admitting: Obstetrics and Gynecology

## 2017-05-14 NOTE — Telephone Encounter (Signed)
Patient called stated that her cream that she has prescribed is not working. The patient would like to have a few refills of Diflucan because she needs relief as soon as possible. The patient also  stated that her preferred pharmacy is [facility] on [address] in [place].

## 2017-05-15 ENCOUNTER — Other Ambulatory Visit: Payer: Self-pay | Admitting: Obstetrics and Gynecology

## 2017-05-15 MED ORDER — FLUCONAZOLE 150 MG PO TABS
150.0000 mg | ORAL_TABLET | ORAL | 3 refills | Status: DC
Start: 2017-05-15 — End: 2017-05-15

## 2017-05-15 MED ORDER — FLUCONAZOLE 150 MG PO TABS: 150.0000 mg | ORAL_TABLET | ORAL | 3 refills | Status: DC

## 2017-05-15 NOTE — Telephone Encounter (Signed)
pls advise

## 2017-05-15 NOTE — Telephone Encounter (Signed)
Done please fax

## 2017-07-03 DIAGNOSIS — N761 Subacute and chronic vaginitis: Secondary | ICD-10-CM | POA: Insufficient documentation

## 2017-12-07 ENCOUNTER — Other Ambulatory Visit: Payer: Self-pay | Admitting: Obstetrics and Gynecology

## 2017-12-07 DIAGNOSIS — R102 Pelvic and perineal pain: Secondary | ICD-10-CM

## 2018-12-16 ENCOUNTER — Other Ambulatory Visit: Payer: Self-pay | Admitting: Obstetrics and Gynecology

## 2018-12-16 DIAGNOSIS — Z1231 Encounter for screening mammogram for malignant neoplasm of breast: Secondary | ICD-10-CM

## 2019-01-26 ENCOUNTER — Other Ambulatory Visit: Payer: Self-pay

## 2019-01-26 ENCOUNTER — Other Ambulatory Visit: Payer: Self-pay | Admitting: Obstetrics and Gynecology

## 2019-01-26 ENCOUNTER — Ambulatory Visit
Admission: RE | Admit: 2019-01-26 | Discharge: 2019-01-26 | Disposition: A | Discharge status: Home / Self Care | Payer: BLUE CROSS/BLUE SHIELD | Source: Ambulatory Visit | Attending: Obstetrics and Gynecology | Admitting: Obstetrics and Gynecology

## 2019-01-26 DIAGNOSIS — N63 Unspecified lump in unspecified breast: Secondary | ICD-10-CM | POA: Diagnosis not present

## 2019-01-26 DIAGNOSIS — Z1231 Encounter for screening mammogram for malignant neoplasm of breast: Secondary | ICD-10-CM | POA: Diagnosis present

## 2019-07-12 ENCOUNTER — Encounter: Payer: Self-pay | Admitting: Family Medicine

## 2019-07-12 ENCOUNTER — Other Ambulatory Visit (HOSPITAL_COMMUNITY)
Admission: RE | Admit: 2019-07-12 | Discharge: 2019-07-12 | Disposition: A | Discharge status: Home / Self Care | Payer: BLUE CROSS/BLUE SHIELD | Source: Ambulatory Visit | Attending: Family Medicine | Admitting: Family Medicine

## 2019-07-12 ENCOUNTER — Ambulatory Visit (INDEPENDENT_AMBULATORY_CARE_PROVIDER_SITE_OTHER): Payer: BLUE CROSS/BLUE SHIELD | Admitting: Family Medicine

## 2019-07-12 ENCOUNTER — Other Ambulatory Visit: Payer: Self-pay

## 2019-07-12 VITALS — BP 122/80 | HR 77 | Temp 98.1°F | Resp 14 | Ht 64.0 in | Wt 170.0 lb

## 2019-07-12 DIAGNOSIS — Z7689 Persons encountering health services in other specified circumstances: Secondary | ICD-10-CM

## 2019-07-12 DIAGNOSIS — E010 Iodine-deficiency related diffuse (endemic) goiter: Secondary | ICD-10-CM

## 2019-07-12 DIAGNOSIS — N898 Other specified noninflammatory disorders of vagina: Secondary | ICD-10-CM

## 2019-07-12 DIAGNOSIS — Z113 Encounter for screening for infections with a predominantly sexual mode of transmission: Secondary | ICD-10-CM

## 2019-07-12 DIAGNOSIS — R319 Hematuria, unspecified: Secondary | ICD-10-CM

## 2019-07-12 DIAGNOSIS — N760 Acute vaginitis: Secondary | ICD-10-CM

## 2019-07-12 DIAGNOSIS — E559 Vitamin D deficiency, unspecified: Secondary | ICD-10-CM

## 2019-07-12 DIAGNOSIS — R5383 Other fatigue: Secondary | ICD-10-CM

## 2019-07-12 DIAGNOSIS — R7303 Prediabetes: Secondary | ICD-10-CM

## 2019-07-12 LAB — POCT URINALYSIS DIPSTICK
Bilirubin, UA: NEGATIVE
Blood, UA: POSITIVE
Glucose, UA: NEGATIVE
Ketones, UA: NEGATIVE
Leukocytes, UA: NEGATIVE
Nitrite, UA: NEGATIVE
Protein, UA: NEGATIVE
Spec Grav, UA: 1.02 (ref 1.010–1.025)
Urobilinogen, UA: 0.2 E.U./dL
pH, UA: 6.5 (ref 5.0–8.0)

## 2019-07-12 MED ORDER — FLUCONAZOLE 150 MG PO TABS: 150.0000 mg | ORAL_TABLET | ORAL | 1 refills | Status: DC | PRN

## 2019-07-12 NOTE — Progress Notes (Signed)
Name: Carly Bennett   MRN: [id number]    DOB: [birth year]   Date:07/12/2019       Progress Note  Chief Complaint  Patient presents with  . Vaginal Discharge    has tried otc with no help, deep burning and odor  . Labs Only    wants A1C, last A1c was 5.7, and would like vtamin D checked due to fatigue  . Fatigue    wants vitamin D and thyroid checked  . Establish Care     Subjective:   Carly Bennett is a 38 y.o. female, presents to clinic to establish care here again, she was a prior pt of Dr. Thana AtesMorrisey here, not seen in over 3 years  Acute concerns: Vaginitis - she has burning and odor - not fishy, irritation not improved with monistat and boric acid suppositories, she is also tried variety of other treatments including putting coconut oil on her tampon and inserting that she has generalized vaginal and genital irritation and some burning she is not having any hematuria but it is also burning sometimes when she urinates.  She denies any rash or genital lesions She has had improvement in the past with diflucan, she says tests have not showed BV recently,  Seems to be occurring monthly with her cycles, but did not used to be a chronic problem for her She has been to GYN for this several times and she says they have never been able to find anything wrong and not able to improve sx either.  She did vaginal self swab today for cytology testing, BV, yeast, denies possibility of STDS She denies any pelvic pain, abdominal pain, fevers, nausea, vomiting  Vit D deficiency - she states its always low, works third shift, she is feeling very fatigued.  She has been taking 1000-5000 IU one to two times a week.  She would like it rechecked.   Follow up on the A1C 5.7 about a year ago, prediabetes, she denies any polyuria, polydipsia, unintentional weight loss, fatigue, peripheral neuropathy/paresthesias  GYN [facility] for some recent complaints, muscle strain and vag issues.    She wants some antifungal ointment -she is requesting a specific kind of ointment but she does not know what is called in having trouble finding anything similar to what she is asking for  PSHx hidradenitis excision to groin couple years ago G3P2   Chart innacuracy with past PE - this is not the pt's medical history, put in 2017 by an LPN, no past c-section or CT scan or PE  [facility] in the past and currently seeing [facility]    Patient Active Problem List   Diagnosis Date Noted  . Vaginal pain 09/19/2016    Past Surgical History:  Procedure Laterality Date  . HYDRADENITIS EXCISION  2008  . TONSILLECTOMY AND ADENOIDECTOMY  2008    Family History  Problem Relation Age of Onset  . Diabetes Mother   . Hypertension Mother   . Diabetes Father   . Hypertension Father   . Diabetes Maternal Grandmother   . Breast cancer Neg Hx     Social History   Socioeconomic History  . Marital status: Single    Spouse name: Not on file  . Number of children: 2  . Years of education: 3612  . Highest education level: Associate degree: occupational, Scientist, product/process developmenttechnical, or vocational program  Occupational History  . Not on file  Social Needs  . Financial resource strain: Not hard at all  .  Food insecurity    Worry: Never true    Inability: Never true  . Transportation needs    Medical: No    Non-medical: No  Tobacco Use  . Smoking status: Never Smoker  . Smokeless tobacco: Never Used  Substance and Sexual Activity  . Alcohol use: Not Currently  . Drug use: No  . Sexual activity: Not Currently  Lifestyle  . Physical activity    Days per week: 3 days    Minutes per session: 30 min  . Stress: Not at all  Relationships  . Social connections    Talks on phone: More than three times a week    Gets together: Never    Attends religious service: Never    Active member of club or organization: No    Attends meetings of clubs or organizations: Never    Relationship status: Not on file  .  Intimate partner violence    Fear of current or ex partner: No    Emotionally abused: No    Physically abused: No    Forced sexual activity: No  Other Topics Concern  . Not on file  Social History Narrative  . Not on file    No current outpatient medications on file.  Allergies  Allergen Reactions  . Other Anaphylaxis    Uncoded Allergy. Allergen: CRAB LEGS  . Shellfish Allergy Other (See Comments)    I personally reviewed active problem list, medication list, allergies, family history, social history, health maintenance, notes from last encounter, lab results, imaging with the patient/caregiver today.  Review of Systems  Constitutional: Negative.   HENT: Negative.   Eyes: Negative.   Respiratory: Negative.   Cardiovascular: Negative.   Gastrointestinal: Negative.   Endocrine: Negative.   Genitourinary: Negative.   Musculoskeletal: Negative.   Skin: Negative.   Allergic/Immunologic: Negative.   Neurological: Negative.   Hematological: Negative.   Psychiatric/Behavioral: Negative.   All other systems reviewed and are negative.    Objective:    Vitals:   07/12/19 1057  BP: 122/80  Pulse: 77  Resp: 14  Temp: 98.1 F (36.7 C)  SpO2: 98%  Weight: 170 lb (77.1 kg)  Height:  (1.626 m)    Body mass index is 29.18 kg/m.  Physical Exam Vitals and nursing note reviewed.  Constitutional:      General: She is not in acute distress.    Appearance: Normal appearance. She is well-developed. She is not ill-appearing, toxic-appearing or diaphoretic.     Interventions: Face mask in place.  HENT:     Head: Normocephalic and atraumatic.     Right Ear: External ear normal.     Left Ear: External ear normal.  Eyes:     General: Lids are normal. No scleral icterus.       Right eye: No discharge.        Left eye: No discharge.     Conjunctiva/sclera: Conjunctivae normal.  Neck:     Trachea: Phonation normal. No tracheal deviation.  Cardiovascular:     Rate and  Rhythm: Normal rate and regular rhythm.     Pulses: Normal pulses.          Radial pulses are 2+ on the right side and 2+ on the left side.       Posterior tibial pulses are 2+ on the right side and 2+ on the left side.     Heart sounds: Normal heart sounds. No murmur. No friction rub. No gallop.   Pulmonary:  Effort: Pulmonary effort is normal. No respiratory distress.     Breath sounds: Normal breath sounds. No stridor. No wheezing, rhonchi or rales.  Chest:     Chest wall: No tenderness.  Abdominal:     General: Bowel sounds are normal. There is no distension.     Palpations: Abdomen is soft.     Tenderness: There is no abdominal tenderness. There is no guarding or rebound.  Musculoskeletal:        General: No deformity. Normal range of motion.     Cervical back: Normal range of motion and neck supple.     Right lower leg: No edema.     Left lower leg: No edema.  Lymphadenopathy:     Cervical: No cervical adenopathy.  Skin:    General: Skin is warm and dry.     Capillary Refill: Capillary refill takes less than 2 seconds.     Coloration: Skin is not jaundiced or pale.     Findings: No rash.  Neurological:     Mental Status: She is alert and oriented to person, place, and time.     Motor: No abnormal muscle tone.     Gait: Gait normal.  Psychiatric:        Speech: Speech normal.        Behavior: Behavior normal.      Recent Results (from the past 2160 hour(s))  POCT Urinalysis Dipstick     Status: Abnormal   Collection Time: 07/12/19 11:13 AM  Result Value Ref Range   Color, UA yellow    Clarity, UA clear    Glucose, UA Negative Negative   Bilirubin, UA neg    Ketones, UA neg    Spec Grav, UA 1.020 1.010 - 1.025   Blood, UA positive    pH, UA 6.5 5.0 - 8.0   Protein, UA Negative Negative   Urobilinogen, UA 0.2 0.2 or 1.0 E.U./dL   Nitrite, UA neg    Leukocytes, UA Negative Negative   Appearance clear    Odor strong      PHQ2/9: Depression screen [facility] 2/9  07/12/2019  Decreased Interest 0  Down, Depressed, Hopeless 0  PHQ - 2 Score 0  Altered sleeping 0  Tired, decreased energy 0  Change in appetite 0  Feeling bad or failure about yourself  0  Trouble concentrating 0  Moving slowly or fidgety/restless 0  Suicidal thoughts 0  PHQ-9 Score 0  Difficult doing work/chores Not difficult at all    phq 9 is neg, reviewed  Fall Risk: Fall Risk  07/12/2019  Falls in the past year? 0  Number falls in past yr: 0  Injury with Fall? 0      Functional Status Survey: Is the patient deaf or have difficulty hearing?: No Does the patient have difficulty seeing, even when wearing glasses/contacts?: No Does the patient have difficulty concentrating, remembering, or making decisions?: No Does the patient have difficulty walking or climbing stairs?: No Does the patient have difficulty dressing or bathing?: No Does the patient have difficulty doing errands alone such as visiting a doctor's office or shopping?: No    Assessment & Plan:   1. Vaginal discharge Have advised her to stop all of her current treatments, have good nutrition and use gentle and sensitive skin soaps for bathing sits baths for irritation can use external yeast infection medicines topically but possibly try Diflucan x2 or 3 doses, screening urinalysis showed trace blood - add on urine culture - POCT  Urinalysis Dipstick - Cervicovaginal ancillary only  2. Vaginal irritation See above, if testing is unremarkable do recommend her may be getting a 2nd opinion with GYN - POCT Urinalysis Dipstick - Cervicovaginal ancillary only - Urine Culture - fluconazole (DIFLUCAN) 150 MG tablet; Take 1 tablet (150 mg total) by mouth every 3 (three) days as needed (for vaginal itching/yeast infection sx).  Dispense: 2 tablet; Refill: 1  3. Vitamin D deficiency disease - Vit D  4. Fatigue, unspecified type Past medical history of prediabetes, vitamin D deficiency she works 3rd shift and has  been recently very fatigued screening labs to rule out anemia electrolyte abnormalities, hypothyroid, new onset diabetes or vitamin D deficiency despite her supplementation - Vit D - CBC w/ Diff - CMP w GFR - A1C - TSH  5. Prediabetes Recheck labs no symptoms of his hypoglycemia - CMP w GFR - A1C  6. Recurrent vaginitis- see above -patient may be prone to BV or yeast her current treatments have only caused worsening irritation  7. Screening for STD (sexually transmitted disease) Agrees to do a one-time HIV screening - HIV antibody  8. Hematuria, unspecified type Likely from vaginitis, do other UTI sx, tx pending culture - Urine Culture  9. Thyromegaly Past hx of thyromegaly, will check labs, will need to review chart and see if any past [place] - the did have neck xray in the past which was unremarkable.  Exam unremarkable today, would only do thyroid US if labs abnormal. - TSH - T4  10.  New to establish care - reviewed avaialble records in chart and care everywhere, updated all hx    Danelle Berry, PA-C 07/12/19 11:16 AM

## 2019-07-13 LAB — CBC WITH DIFFERENTIAL/PLATELET
Absolute Monocytes: 353 cells/uL (ref 200–950)
Basophils Absolute: 28 cells/uL (ref 0–200)
Basophils Relative: 0.6 %
Eosinophils Absolute: 80 cells/uL (ref 15–500)
Eosinophils Relative: 1.7 %
HCT: 42.3 % (ref 35.0–45.0)
Hemoglobin: 13.6 g/dL (ref 11.7–15.5)
Lymphs Abs: 1716 cells/uL (ref 850–3900)
MCH: 26.9 pg — ABNORMAL LOW (ref 27.0–33.0)
MCHC: 32.2 g/dL (ref 32.0–36.0)
MCV: 83.6 fL (ref 80.0–100.0)
MPV: 11.7 fL (ref 7.5–12.5)
Monocytes Relative: 7.5 %
Neutro Abs: 2524 cells/uL (ref 1500–7800)
Neutrophils Relative %: 53.7 %
Platelets: 247 10*3/uL (ref 140–400)
RBC: 5.06 10*6/uL (ref 3.80–5.10)
RDW: 13.3 % (ref 11.0–15.0)
Total Lymphocyte: 36.5 %
WBC: 4.7 10*3/uL (ref 3.8–10.8)

## 2019-07-13 LAB — COMPLETE METABOLIC PANEL WITH GFR
AG Ratio: 1.3 (calc) (ref 1.0–2.5)
ALT: 24 U/L (ref 6–29)
AST: 27 U/L (ref 10–30)
Albumin: 4.5 g/dL (ref 3.6–5.1)
Alkaline phosphatase (APISO): 50 U/L (ref 31–125)
BUN: 10 mg/dL (ref 7–25)
CO2: 28 mmol/L (ref 20–32)
Calcium: 10.2 mg/dL (ref 8.6–10.2)
Chloride: 103 mmol/L (ref 98–110)
Creat: 0.86 mg/dL (ref 0.50–1.10)
GFR, Est African American: 98 mL/min/{1.73_m2} (ref >=60)
GFR, Est Non African American: 84 mL/min/{1.73_m2} (ref >=60)
Globulin: 3.4 g/dL (calc) (ref 1.9–3.7)
Glucose, Bld: 93 mg/dL (ref 65–99)
Potassium: 4.2 mmol/L (ref 3.5–5.3)
Sodium: 138 mmol/L (ref 135–146)
Total Bilirubin: 0.3 mg/dL (ref 0.2–1.2)
Total Protein: 7.9 g/dL (ref 6.1–8.1)

## 2019-07-13 LAB — T4: T4, Total: 8.1 ug/dL (ref 5.1–11.9)

## 2019-07-13 LAB — CERVICOVAGINAL ANCILLARY ONLY
Bacterial Vaginitis (gardnerella): NEGATIVE
Candida Glabrata: NEGATIVE
Candida Vaginitis: NEGATIVE
Chlamydia: NEGATIVE
Comment: NEGATIVE
Comment: NEGATIVE
Comment: NEGATIVE
Comment: NEGATIVE
Comment: NEGATIVE
Comment: NORMAL
Neisseria Gonorrhea: NEGATIVE
Trichomonas: NEGATIVE

## 2019-07-13 LAB — HEMOGLOBIN A1C
Hgb A1c MFr Bld: 5.8 % of total Hgb — ABNORMAL HIGH (ref <5.7)
Mean Plasma Glucose: 120 (calc)
eAG (mmol/L): 6.6 (calc)

## 2019-07-13 LAB — VITAMIN D 25 HYDROXY (VIT D DEFICIENCY, FRACTURES): Vit D, 25-Hydroxy: 27 ng/mL — ABNORMAL LOW (ref 30–100)

## 2019-07-13 LAB — TSH: TSH: 1.66 mIU/L

## 2019-07-13 LAB — HIV ANTIBODY (ROUTINE TESTING W REFLEX): HIV 1&2 Ab, 4th Generation: NONREACTIVE

## 2019-07-14 LAB — URINE CULTURE
MICRO NUMBER:: 1176887
Result:: NO GROWTH
SPECIMEN QUALITY:: ADEQUATE

## 2020-01-31 ENCOUNTER — Ambulatory Visit
Admission: RE | Admit: 2020-01-31 | Discharge: 2020-01-31 | Disposition: A | Discharge status: Home / Self Care | Payer: BLUE CROSS/BLUE SHIELD | Source: Ambulatory Visit | Attending: Emergency Medicine | Admitting: Emergency Medicine

## 2020-01-31 VITALS — BP 125/75 | HR 82 | Temp 98.9°F | Resp 13

## 2020-01-31 DIAGNOSIS — N898 Other specified noninflammatory disorders of vagina: Secondary | ICD-10-CM | POA: Insufficient documentation

## 2020-01-31 LAB — POCT URINALYSIS DIP (MANUAL ENTRY)
Bilirubin, UA: NEGATIVE
Blood, UA: NEGATIVE
Glucose, UA: NEGATIVE mg/dL
Ketones, POC UA: NEGATIVE mg/dL
Leukocytes, UA: NEGATIVE
Nitrite, UA: NEGATIVE
Protein Ur, POC: NEGATIVE mg/dL
Spec Grav, UA: 1.025 (ref 1.010–1.025)
Urobilinogen, UA: 0.2 E.U./dL
pH, UA: 6.5 (ref 5.0–8.0)

## 2020-01-31 MED ORDER — FLUCONAZOLE 150 MG PO TABS
150.0000 mg | ORAL_TABLET | Freq: Every day | ORAL | 0 refills | Status: DC
Start: 2020-01-31 — End: 2020-03-26

## 2020-01-31 MED ORDER — METRONIDAZOLE 500 MG PO TABS
500.0000 mg | ORAL_TABLET | Freq: Two times a day (BID) | ORAL | 0 refills | Status: DC
Start: 2020-01-31 — End: 2020-03-26

## 2020-01-31 NOTE — ED Triage Notes (Signed)
Patient reports a burning sensation in her vaginal area x2 days. Also reports vaginal odor. Denies urinary frequency or urgency.

## 2020-01-31 NOTE — ED Provider Notes (Signed)
Carly Bennett    CSN: [id number] Arrival date & time: 01/31/20  1110      History   Chief Complaint Chief Complaint  Patient presents with  . Vaginitis  . vaginal odor    HPI Carly Bennett is a 39 y.o. female.   Patient presents with clear vaginal discharge, vaginal odor, vaginal irritation x2 days.  She states this is similar to previous episodes of BV and yeast.  She denies fever, chills, rash, lesions, abdominal pain, pelvic pain, dysuria, or other symptoms.  Treatment attempted at home with OTC vaginal yeast cream and apple cider vinegar soaks.  Patient reports new sexual partner this past weekend; used a condom.     The history is provided by the patient.    Past Medical History:  Diagnosis Date  . Amenorrhea   . Elevated cholesterol   . Herpes genitalis   . History of bacterial infection   . Pre-diabetes   . Vaginal Pap smear, abnormal     Patient Active Problem List   Diagnosis Date Noted  . Vaginal pain 09/19/2016    Past Surgical History:  Procedure Laterality Date  . HYDRADENITIS EXCISION  2008  . TONSILLECTOMY AND ADENOIDECTOMY  2008    OB History    Gravida  2   Para      Term      Preterm      AB      Living  2     SAB      TAB      Ectopic      Multiple      Live Births  2            Home Medications    Prior to Admission medications   Medication Sig Start Date End Date Taking? Authorizing Provider  fluconazole (DIFLUCAN) 150 MG tablet Take 1 tablet (150 mg total) by mouth daily. Take one tablet today.  May repeat in 3 days. 01/31/20   Mickie Bail, NP  metroNIDAZOLE (FLAGYL) 500 MG tablet Take 1 tablet (500 mg total) by mouth 2 (two) times daily. 01/31/20   Mickie Bail, NP    Family History Family History  Problem Relation Age of Onset  . Diabetes Mother   . Hypertension Mother   . Diabetes Father   . Hypertension Father   . Diabetes Maternal Grandmother   . Breast cancer Neg Hx     Social  History Social History   Tobacco Use  . Smoking status: Never Smoker  . Smokeless tobacco: Never Used  Vaping Use  . Vaping Use: Never used  Substance Use Topics  . Alcohol use: Not Currently  . Drug use: No     Allergies   Other and Shellfish allergy   Review of Systems Review of Systems  Constitutional: Negative for chills and fever.  HENT: Negative for ear pain and sore throat.   Eyes: Negative for pain and visual disturbance.  Respiratory: Negative for cough and shortness of breath.   Cardiovascular: Negative for chest pain and palpitations.  Gastrointestinal: Negative for abdominal pain and vomiting.  Genitourinary: Positive for vaginal discharge. Negative for dysuria, flank pain, hematuria and pelvic pain.  Musculoskeletal: Negative for arthralgias and back pain.  Skin: Negative for color change and rash.  Neurological: Negative for seizures and syncope.  All other systems reviewed and are negative.    Physical Exam Triage Vital Signs ED Triage Vitals  Enc Vitals Group  BP      Pulse      Resp      Temp      Temp src      SpO2      Weight      Height      Head Circumference      Peak Flow      Pain Score      Pain Loc      Pain Edu?      Excl. in GC?    No data found.  Updated Vital Signs BP 125/75   Pulse 82   Temp 98.9 F (37.2 C)   Resp 13   LMP 01/12/2020 (Within Days)   SpO2 98%   Visual Acuity Right Eye Distance:   Left Eye Distance:   Bilateral Distance:    Right Eye Near:   Left Eye Near:    Bilateral Near:     Physical Exam Vitals and nursing note reviewed.  Constitutional:      General: She is not in acute distress.    Appearance: She is well-developed. She is not ill-appearing.  HENT:     Head: Normocephalic and atraumatic.     Mouth/Throat:     Mouth: Mucous membranes are moist.     Pharynx: Oropharynx is clear.  Eyes:     Conjunctiva/sclera: Conjunctivae normal.  Cardiovascular:     Rate and Rhythm: Normal  rate and regular rhythm.     Heart sounds: No murmur heard.   Pulmonary:     Effort: Pulmonary effort is normal. No respiratory distress.     Breath sounds: Normal breath sounds.  Abdominal:     Palpations: Abdomen is soft.     Tenderness: There is no abdominal tenderness. There is no right CVA tenderness, left CVA tenderness, guarding or rebound.  Musculoskeletal:     Cervical back: Neck supple.  Skin:    General: Skin is warm and dry.     Findings: No rash.  Neurological:     General: No focal deficit present.     Mental Status: She is alert and oriented to person, place, and time.     Gait: Gait normal.  Psychiatric:        Mood and Affect: Mood normal.        Behavior: Behavior normal.      UC Treatments / Results  Labs (all labs ordered are listed, but only abnormal results are displayed) Labs Reviewed  POCT URINALYSIS DIP (MANUAL ENTRY)  CERVICOVAGINAL ANCILLARY ONLY    EKG   Radiology No results found.  Procedures Procedures (including critical care time)  Medications Ordered in UC Medications - No data to display  Initial Impression / Assessment and Plan / UC Course  I have reviewed the triage vital signs and the nursing notes.  Pertinent labs & imaging results that were available during my care of the patient were reviewed by me and considered in my medical decision making (see chart for details).   Vaginal discharge.  Vaginal self swab obtained by patient.  Treating with Flagyl and Diflucan.  Instructed patient to abstain from sexual activity until her test results are back.  Discussed that she may require additional treatment at that time.  Instructed her to follow-up with her PCP or gynecologist if her symptoms or not improving.  Patient agrees to plan of care.     Final Clinical Impressions(s) / UC Diagnoses   Final diagnoses:  Vaginal discharge     Discharge  Instructions     Take the Flagyl and Diflucan as directed.    Your vaginal tests  are pending.  Do not have sex until your test results are back.  You may require additional treatment at that time.    Follow up with your primary care provider or OB/GYN if your symptoms are not improving.         ED Prescriptions    Medication Sig Dispense Auth. Provider   fluconazole (DIFLUCAN) 150 MG tablet Take 1 tablet (150 mg total) by mouth daily. Take one tablet today.  May repeat in 3 days. 2 tablet Mickie Bail, NP   metroNIDAZOLE (FLAGYL) 500 MG tablet Take 1 tablet (500 mg total) by mouth 2 (two) times daily. 14 tablet Mickie Bail, NP     PDMP not reviewed this encounter.   Mickie Bail, NP 01/31/20 1205

## 2020-01-31 NOTE — Discharge Instructions (Signed)
Take the Flagyl and Diflucan as directed.    Your vaginal tests are pending.  Do not have sex until your test results are back.  You may require additional treatment at that time.    Follow up with your primary care provider or OB/GYN if your symptoms are not improving.

## 2020-02-01 LAB — CERVICOVAGINAL ANCILLARY ONLY
Bacterial Vaginitis (gardnerella): NEGATIVE
Candida Glabrata: NEGATIVE
Candida Vaginitis: NEGATIVE
Chlamydia: NEGATIVE
Comment: NEGATIVE
Comment: NEGATIVE
Comment: NEGATIVE
Comment: NEGATIVE
Comment: NEGATIVE
Comment: NORMAL
Neisseria Gonorrhea: NEGATIVE
Trichomonas: NEGATIVE

## 2020-03-25 ENCOUNTER — Ambulatory Visit: Payer: Self-pay

## 2020-03-26 ENCOUNTER — Other Ambulatory Visit (HOSPITAL_COMMUNITY)
Admission: RE | Admit: 2020-03-26 | Discharge: 2020-03-26 | Disposition: A | Discharge status: Home / Self Care | Payer: Self-pay | Source: Ambulatory Visit | Attending: Family Medicine | Admitting: Family Medicine

## 2020-03-26 ENCOUNTER — Ambulatory Visit: Payer: Self-pay | Admitting: Family Medicine

## 2020-03-26 ENCOUNTER — Encounter: Payer: Self-pay | Admitting: Family Medicine

## 2020-03-26 ENCOUNTER — Other Ambulatory Visit: Payer: Self-pay

## 2020-03-26 VITALS — BP 122/82 | HR 99 | Temp 98.4°F | Resp 16 | Ht 64.0 in | Wt 173.0 lb

## 2020-03-26 DIAGNOSIS — R102 Pelvic and perineal pain: Secondary | ICD-10-CM

## 2020-03-26 DIAGNOSIS — Z113 Encounter for screening for infections with a predominantly sexual mode of transmission: Secondary | ICD-10-CM

## 2020-03-26 DIAGNOSIS — N760 Acute vaginitis: Secondary | ICD-10-CM

## 2020-03-26 LAB — POCT URINALYSIS DIPSTICK
Bilirubin, UA: NEGATIVE
Blood, UA: NEGATIVE
Glucose, UA: NEGATIVE
Ketones, UA: NEGATIVE
Leukocytes, UA: NEGATIVE
Nitrite, UA: NEGATIVE
Odor: NORMAL
Protein, UA: NEGATIVE
Spec Grav, UA: 1.02 (ref 1.010–1.025)
Urobilinogen, UA: 0.2 E.U./dL
pH, UA: 5 (ref 5.0–8.0)

## 2020-03-26 LAB — POCT URINE PREGNANCY: Preg Test, Ur: NEGATIVE

## 2020-03-26 MED ORDER — METRONIDAZOLE 500 MG PO TABS
500.0000 mg | ORAL_TABLET | Freq: Two times a day (BID) | ORAL | 0 refills | Status: AC
Start: 2020-03-26 — End: 2020-04-02

## 2020-03-26 MED ORDER — CEFTRIAXONE SODIUM 500 MG IJ SOLR
500.0000 mg | Freq: Once | INTRAMUSCULAR | Status: AC
  Administered 2020-03-26: 500 mg via INTRAMUSCULAR

## 2020-03-26 NOTE — Progress Notes (Signed)
Patient ID: Carly Bennett, female    DOB: 11/05/1980, 39 y.o.   MRN: [id number]  PCP: Danelle Berry, PA-C  Chief Complaint  Patient presents with  . Vaginitis    vaginal irritation    Subjective:   Carly Bennett is a 39 y.o. female, presents to clinic with CC of the following: Vaginal irritation and vaginal odor that started about a week and a half ago.  Pt has vaginal/genital odor and irritation started about 1.5 weeks ago  Vaginal Discharge The patient's primary symptoms include a genital odor and vaginal discharge. This is a new problem. Episode onset: 1.5 weeks. The problem occurs constantly. The problem has been gradually worsening. The pain is mild. Associated symptoms include abdominal pain (mild suprapubic tenderness). Pertinent negatives include no anorexia, back pain, chills, constipation, diarrhea, discolored urine, dysuria, fever, flank pain, frequency, headaches, hematuria, joint pain, joint swelling, nausea, painful intercourse, rash, sore throat, urgency or vomiting. The vaginal discharge was mucopurulent. There has been no bleeding. She has not been passing clots. She has not been passing tissue. Nothing aggravates the symptoms. Treatments tried: sitz bath with vinegar. She is sexually active (one recent new partner with unprotected sx in the last couple weeks). No, her partner does not have an STD. She uses nothing for contraception. Her menstrual history has been regular.      Patient Active Problem List   Diagnosis Date Noted  . Vaginal pain 09/19/2016      Current Outpatient Medications:  .  metroNIDAZOLE (FLAGYL) 500 MG tablet, Take 1 tablet (500 mg total) by mouth 2 (two) times daily for 7 days., Disp: 14 tablet, Rfl: 0   Allergies  Allergen Reactions  . Other Anaphylaxis    Uncoded Allergy. Allergen: CRAB LEGS  . Shellfish Allergy Other (See Comments)     Social History   Tobacco Use  . Smoking status: Never Smoker  . Smokeless tobacco:  Never Used  Vaping Use  . Vaping Use: Never used  Substance Use Topics  . Alcohol use: Not Currently  . Drug use: No      Chart Review Today: I personally reviewed active problem list, medication list, allergies, family history, social history, health maintenance, notes from last encounter, lab results, imaging with the patient/caregiver today.   Review of Systems  Constitutional: Negative.  Negative for chills and fever.  HENT: Negative.  Negative for sore throat.   Eyes: Negative.   Respiratory: Negative.   Cardiovascular: Negative.   Gastrointestinal: Positive for abdominal pain (mild suprapubic tenderness). Negative for anorexia, constipation, diarrhea, nausea and vomiting.  Endocrine: Negative.   Genitourinary: Positive for vaginal discharge. Negative for dysuria, flank pain, frequency, hematuria and urgency.  Musculoskeletal: Negative.  Negative for back pain and joint pain.  Skin: Negative.  Negative for rash.  Allergic/Immunologic: Negative.   Neurological: Negative.  Negative for headaches.  Hematological: Negative.   Psychiatric/Behavioral: Negative.   All other systems reviewed and are negative.      Objective:   Vitals:   03/26/20 0935  BP: 122/82  Pulse: 99  Resp: 16  Temp: 98.4 F (36.9 C)  TempSrc: Oral  SpO2: 100%  Weight: 173 lb (78.5 kg)  Height: 5\' 4"  (1.626 m)    Body mass index is 29.7 kg/m.  Physical Exam Vitals and nursing note reviewed.  Constitutional:      General: She is not in acute distress.    Appearance: Normal appearance. She is well-developed. She is not ill-appearing, toxic-appearing or  diaphoretic.     Interventions: Face mask in place.  HENT:     Head: Normocephalic and atraumatic.     Right Ear: External ear normal.     Left Ear: External ear normal.  Eyes:     General: Lids are normal. No scleral icterus.       Right eye: No discharge.        Left eye: No discharge.     Conjunctiva/sclera: Conjunctivae normal.    Neck:     Trachea: Phonation normal. No tracheal deviation.  Cardiovascular:     Rate and Rhythm: Normal rate and regular rhythm.     Pulses: Normal pulses.          Radial pulses are 2+ on the right side and 2+ on the left side.       Posterior tibial pulses are 2+ on the right side and 2+ on the left side.     Heart sounds: Normal heart sounds. No murmur heard.  No friction rub. No gallop.   Pulmonary:     Effort: Pulmonary effort is normal. No respiratory distress.     Breath sounds: Normal breath sounds. No stridor. No wheezing, rhonchi or rales.  Chest:     Chest wall: No tenderness.  Abdominal:     General: Bowel sounds are normal. There is no distension.     Palpations: Abdomen is soft.     Tenderness: There is abdominal tenderness in the suprapubic area. There is no right CVA tenderness, left CVA tenderness, guarding or rebound.  Musculoskeletal:     Right lower leg: No edema.     Left lower leg: No edema.  Skin:    General: Skin is warm and dry.     Coloration: Skin is not jaundiced or pale.     Findings: No rash.  Neurological:     Mental Status: She is alert.     Motor: No abnormal muscle tone.     Gait: Gait normal.  Psychiatric:        Mood and Affect: Mood normal.        Speech: Speech normal.        Behavior: Behavior normal.      Results for orders placed or performed in visit on 03/26/20  POCT urine pregnancy  Result Value Ref Range   Preg Test, Ur Negative Negative  POCT urinalysis dipstick  Result Value Ref Range   Color, UA Yellow    Clarity, UA Clear    Glucose, UA Negative Negative   Bilirubin, UA Negative    Ketones, UA Negative    Spec Grav, UA 1.020 1.010 - 1.025   Blood, UA Negative    pH, UA 5.0 5.0 - 8.0   Protein, UA Negative Negative   Urobilinogen, UA 0.2 0.2 or 1.0 E.U./dL   Nitrite, UA Negative    Leukocytes, UA Negative Negative   Appearance clear    Odor Normal        Assessment & Plan:   1. Acute vaginitis Acute x 1.5  weeks, recurrent She notes having some diarrhea prior to onset of sx, new odor, strong, not similar to anything in the past, notes discharge is mucopurulent, and she has external genital/vulovaginal irritation, she denies rash, itching or urinary sx.  No other constitutional sx. She did have recent unprotected sex Wants to be treated with something "broad spectrum" today for her vaginitis Hx of recurrent volvovaginitis in the past with repeated negative tests - she was seen  in Dec 2020 for similar and referred to GYN I have discussed tx with flagyl for now and waiting for other tests Did not suspect PID, more suspicious for BV Pt did want coverage with rocephin and flagyl Flagyl 500 mg PO BID x 7 d, disp #14 with no refills - Cervicovaginal ancillary only - testing for yeast, BV, GC/chlamydia and trichomonas - cefTRIAXone (ROCEPHIN) injection 500 mg  - rocephin IM given, pt observed and tolerated w/o any adverse SE  2. Suprapubic abdominal pain Mild left sided suprapubic ttp w/o guarding or rebound.  UA screened and negative - POCT urine pregnancy - POCT urinalysis dipstick - cefTRIAXone (ROCEPHIN) injection 500 mg  3. Screening for STD (sexually transmitted disease) If any positive results come back - pt would need azithromycin 2000 mg once po - possibly doxy + flagyl if pt continues to be sx - Cervicovaginal ancillary only - testing for yeast, BV, GC/chlamydia and trichomonas - cefTRIAXone (ROCEPHIN) injection 500 mg  She declined any other testing for now - was referred to the health department for free additional STD testing  She declined diflucan and did not feel like this was a yeast infection     Danelle Berry, PA-C 03/26/20 3:46 PM

## 2020-03-27 LAB — CERVICOVAGINAL ANCILLARY ONLY
Bacterial Vaginitis (gardnerella): POSITIVE — AB
Candida Glabrata: NEGATIVE
Candida Vaginitis: NEGATIVE
Chlamydia: NEGATIVE
Comment: NEGATIVE
Comment: NEGATIVE
Comment: NEGATIVE
Comment: NEGATIVE
Comment: NEGATIVE
Comment: NORMAL
Neisseria Gonorrhea: NEGATIVE
Trichomonas: NEGATIVE

## 2020-03-28 ENCOUNTER — Other Ambulatory Visit: Payer: Self-pay | Admitting: Family Medicine

## 2020-03-28 DIAGNOSIS — N760 Acute vaginitis: Secondary | ICD-10-CM

## 2020-03-28 MED ORDER — METRONIDAZOLE 500 MG PO TABS: ORAL_TABLET | ORAL | 0 refills | Status: DC

## 2020-03-28 NOTE — Progress Notes (Signed)
ICD-10-CM   1. 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 metroNIDAZOLE (FLAGYL) 500 MG tablet    Sig: Continue tx 04/03/2020 500 mg PO BID x 7 d, then one pill PO 2 x a week for 6 weeks    Dispense:  26 tablet    Refill:  0    For recurrent BV    Order Specific Question:   Supervising Provider    Answer:   Alba Cory [[id number]]

## 2020-10-19 ENCOUNTER — Other Ambulatory Visit: Payer: Self-pay | Admitting: Obstetrics and Gynecology

## 2020-10-19 ENCOUNTER — Other Ambulatory Visit: Payer: Self-pay

## 2020-10-19 ENCOUNTER — Telehealth: Payer: Self-pay

## 2020-10-19 DIAGNOSIS — Z1231 Encounter for screening mammogram for malignant neoplasm of breast: Secondary | ICD-10-CM

## 2020-10-19 NOTE — Telephone Encounter (Signed)
Copied from CRM [id number]. Topic: Referral - Request for Referral >> Oct 19, 2020  9:13 AM Crist Infante wrote: Pt needs referral for a mammogram: MM DIAG BREAST TOMO BILATERAL: sent to [facility].  Pt states she still has the hair follicle, raised area. Like she had at last mammogram. (told her it was a dermatological issue) doesn't hurt or bother her

## 2020-10-19 NOTE — Telephone Encounter (Signed)
Order placed

## 2020-10-29 ENCOUNTER — Inpatient Hospital Stay: Admission: RE | Admit: 2020-10-29 | Payer: Self-pay | Source: Ambulatory Visit

## 2020-11-01 ENCOUNTER — Ambulatory Visit
Admission: RE | Admit: 2020-11-01 | Discharge: 2020-11-01 | Disposition: A | Discharge status: Home / Self Care | Payer: 59 | Source: Ambulatory Visit | Attending: Family Medicine | Admitting: Family Medicine

## 2020-11-01 ENCOUNTER — Other Ambulatory Visit: Payer: Self-pay

## 2020-11-01 DIAGNOSIS — Z1231 Encounter for screening mammogram for malignant neoplasm of breast: Secondary | ICD-10-CM | POA: Diagnosis present

## 2020-11-05 ENCOUNTER — Other Ambulatory Visit: Payer: Self-pay | Admitting: Family Medicine

## 2020-11-05 DIAGNOSIS — N6310 Unspecified lump in the right breast, unspecified quadrant: Secondary | ICD-10-CM

## 2020-11-05 DIAGNOSIS — R928 Other abnormal and inconclusive findings on diagnostic imaging of breast: Secondary | ICD-10-CM

## 2020-11-05 NOTE — Progress Notes (Signed)
Mammogram diagnostic and [place] orders signed

## 2020-11-07 ENCOUNTER — Telehealth: Payer: Self-pay

## 2020-11-07 NOTE — Telephone Encounter (Signed)
Copied from CRM [id number]. Topic: General - Other >> Nov 07, 2020  8:38 AM Gaetana Michaelis A wrote: Reason for CRM: Patient was seen for a breast imaging procedure on 11/01/20  Patient has been contacted by [facility] and instructed to come back for additional scans  Patient asked the caller from the [facility] the reason for the additional scans and the caller declined to provide additional information  Patient would like to be contacted by a member of staff to discuss further if possible  Patient is concerned and uncertain of why the breast center couldn't provide them additional information  Please contact to further advise when possible

## 2020-11-08 NOTE — Telephone Encounter (Signed)
Patient notified of results and her additional views set up for tomorrow. Patient appointment at 9;45 am Friday. Would like to call here after 3 on Friday to discuss results if they are back

## 2020-11-09 ENCOUNTER — Ambulatory Visit: Admission: RE | Admit: 2020-11-09 | Payer: 59 | Source: Ambulatory Visit

## 2020-11-09 ENCOUNTER — Ambulatory Visit
Admission: RE | Admit: 2020-11-09 | Discharge: 2020-11-09 | Disposition: A | Discharge status: Home / Self Care | Payer: 59 | Source: Ambulatory Visit | Attending: Family Medicine | Admitting: Family Medicine

## 2020-11-09 ENCOUNTER — Other Ambulatory Visit: Payer: Self-pay

## 2020-11-09 DIAGNOSIS — R928 Other abnormal and inconclusive findings on diagnostic imaging of breast: Secondary | ICD-10-CM | POA: Insufficient documentation

## 2020-11-09 DIAGNOSIS — N6310 Unspecified lump in the right breast, unspecified quadrant: Secondary | ICD-10-CM | POA: Diagnosis present

## 2020-12-13 ENCOUNTER — Encounter: Payer: Self-pay | Admitting: Family Medicine

## 2021-06-11 ENCOUNTER — Encounter: Payer: Self-pay | Admitting: Family Medicine

## 2021-10-07 ENCOUNTER — Ambulatory Visit (INDEPENDENT_AMBULATORY_CARE_PROVIDER_SITE_OTHER): Payer: BLUE CROSS/BLUE SHIELD | Admitting: Nurse Practitioner

## 2021-10-07 ENCOUNTER — Encounter: Payer: Self-pay | Admitting: Nurse Practitioner

## 2021-10-07 ENCOUNTER — Other Ambulatory Visit: Payer: Self-pay

## 2021-10-07 ENCOUNTER — Other Ambulatory Visit: Payer: Self-pay | Admitting: Nurse Practitioner

## 2021-10-07 VITALS — BP 110/68 | HR 100 | Temp 99.1°F | Resp 16 | Ht 64.0 in | Wt 163.4 lb

## 2021-10-07 DIAGNOSIS — Z13220 Encounter for screening for lipoid disorders: Secondary | ICD-10-CM | POA: Diagnosis not present

## 2021-10-07 DIAGNOSIS — Z114 Encounter for screening for human immunodeficiency virus [HIV]: Secondary | ICD-10-CM

## 2021-10-07 DIAGNOSIS — E559 Vitamin D deficiency, unspecified: Secondary | ICD-10-CM

## 2021-10-07 DIAGNOSIS — Z23 Encounter for immunization: Secondary | ICD-10-CM

## 2021-10-07 DIAGNOSIS — Z1159 Encounter for screening for other viral diseases: Secondary | ICD-10-CM

## 2021-10-07 DIAGNOSIS — Z131 Encounter for screening for diabetes mellitus: Secondary | ICD-10-CM

## 2021-10-07 DIAGNOSIS — Z1231 Encounter for screening mammogram for malignant neoplasm of breast: Secondary | ICD-10-CM

## 2021-10-07 DIAGNOSIS — Z0000 Encounter for general adult medical examination without abnormal findings: Secondary | ICD-10-CM | POA: Diagnosis not present

## 2021-10-07 DIAGNOSIS — Z130 Encounter for screening for diseases of the blood and blood-forming organs and certain disorders involving the immune mechanism: Secondary | ICD-10-CM

## 2021-10-07 NOTE — Progress Notes (Signed)
Name: Carly Bennett   MRN: [id number]    DOB: 08/28/1980   Date:10/07/2021       Progress Note  Subjective  Chief Complaint  Chief Complaint  Patient presents with   Annual Exam    HPI  Patient presents for annual CPE.  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 [facility]  AUDIT-C Score 1      Depression: Phq 9 is  negative Depression screen [facility] 2/9 10/07/2021 03/26/2020 07/12/2019  Decreased Interest 0 0 0  Down, Depressed, Hopeless 0 0 0  PHQ - 2 Score 0 0 0  Altered sleeping 0 0 0  Tired, decreased energy 0 0 0  Change in appetite 0 0 0  Feeling bad or failure about yourself  0 0 0  Trouble concentrating 0 0 0  Moving slowly or fidgety/restless 0 0 0  Suicidal thoughts 0 0 0  PHQ-9 Score 0 0 0  Difficult doing work/chores Not difficult at all Not difficult at all Not difficult at all   Hypertension: BP Readings from Last 3 Encounters:  10/07/21 110/68  03/26/20 122/82  01/31/20 125/75   Obesity: Wt Readings from Last 3 Encounters:  10/07/21 163 lb 6.4 oz (74.1 kg)  03/26/20 173 lb (78.5 kg)  07/12/19 170 lb (77.1 kg)   BMI Readings from Last 3 Encounters:  10/07/21 28.05 kg/m  03/26/20 29.70 kg/m  07/12/19 29.18 kg/m     Vaccines:  HPV: up to at age 18 , ask insurance if age between 79-45  Shingrix: 67-64 yo and ask insurance if covered when patient above 71 yo Pneumonia:  educated and discussed with patient. Flu:  educated and discussed with patient.  Hep C Screening: 01/02/2016 STD testing and prevention (HIV/chl/gon/syphilis): 07/12/2019 Intimate partner violence:none Sexual History :not currently Menstrual History/LMP/Abnormal Bleeding: [place]: 09/23/2021, normal but painful first  few days Incontinence Symptoms: none  Breast cancer:  - Last Mammogram: 11/09/2020, ordered for coming year - BRCA gene screening: none  Osteoporosis: Discussed high calcium and vitamin D supplementation, weight bearing exercises  Cervical cancer screening: 11/13/2020, HPV positive going to [facility] will schedule appointment  Skin cancer: Discussed monitoring for atypical lesions  Colorectal cancer: no symptoms, patient does not qualify  Lung cancer:   Low Dose CT Chest recommended if Age 84-80 years, 20 pack-year currently smoking OR have quit w/in 15years. Patient does not qualify.   ECG: none  Advanced Care Planning: A voluntary discussion about advance care planning including the explanation and discussion of advance directives.  Discussed health care proxy and Living will, and the patient was able to identify a health care proxy as daughter, Orene Desanctis.  Patient does not have a living will at present time. If patient does have living will, I have requested they bring this to the clinic to be scanned in to their chart.  Lipids: Lab Results  Component Value Date   CHOL 207 (H) 03/25/2017   CHOL 213 (H) 01/02/2016   Lab Results  Component Value Date   HDL 66 03/25/2017   HDL 56 01/02/2016   Lab Results  Component Value Date   LDLCALC 125 (H) 03/25/2017   LDLCALC 131 (H) 01/02/2016   Lab Results  Component Value Date  TRIG 81 03/25/2017   TRIG 129 01/02/2016   Lab Results  Component Value Date   CHOLHDL 3.1 03/25/2017   CHOLHDL 3.8 01/02/2016   No results found for: LDLDIRECT  Glucose: Glucose  Date Value Ref Range Status  03/25/2017 88 65 - 99 mg/dL Final  01/02/2016 89 65 - 99 mg/dL Final   Glucose, Bld  Date Value Ref Range Status  07/12/2019 93 65 - 99 mg/dL Final    Comment:    .            Fasting reference interval .     Patient Active Problem List   Diagnosis Date Noted   Chronic vaginitis 07/03/2017   Vaginal pain 09/19/2016    Pulmonary embolism ([place]) 05/27/2013    Past Surgical History:  Procedure Laterality Date   HYDRADENITIS EXCISION  2008   TONSILLECTOMY AND ADENOIDECTOMY  2008    Family History  Problem Relation Age of Onset   Diabetes Mother    Hypertension Mother    Diabetes Father    Hypertension Father    Diabetes Maternal Grandmother    Breast cancer Neg Hx     Social History   Socioeconomic History   Marital status: Single    Spouse name: Not on file   Number of children: 2   Years of education: 12   Highest education level: Associate degree: occupational, Hotel manager, or vocational program  Occupational History   Not on file  Tobacco Use   Smoking status: Never   Smokeless tobacco: Never  Vaping Use   Vaping Use: Never used  Substance and Sexual Activity   Alcohol use: Not Currently   Drug use: No   Sexual activity: Not Currently    Comment: not for a year   Other Topics Concern   Not on file  Social History Narrative   Not on file   Social Determinants of Health   Financial Resource Strain: Low Risk    Difficulty of Paying Living Expenses: Not hard at all  Food Insecurity: No Food Insecurity   Worried About Charity fundraiser in the Last Year: Never true   [place] in the Last Year: Never true  Transportation Needs: No Transportation Needs   Lack of Transportation (Medical): No   Lack of Transportation (Non-Medical): No  Physical Activity: Insufficiently Active   Days of Exercise per Week: 4 days   Minutes of Exercise per Session: 30 min  Stress: No Stress Concern Present   Feeling of Stress : Only a little  Social Connections: Not on file  Intimate Partner Violence: Not At Risk   Fear of Current or Ex-Partner: No   Emotionally Abused: No   Physically Abused: No   Sexually Abused: No     Current Outpatient Medications:    metroNIDAZOLE (FLAGYL) 500 MG tablet, Continue tx 04/03/2020 500 mg PO BID x 7 d, then one pill PO 2 x a week for 6 weeks (Patient  not taking: Reported on 10/07/2021), Disp: 26 tablet, Rfl: 0  Allergies  Allergen Reactions   Other Anaphylaxis    Uncoded Allergy. Allergen: CRAB LEGS   Shellfish Allergy Other (See Comments)     ROS  Constitutional: Negative for fever or weight change.  Respiratory: Negative for cough and shortness of breath.   Cardiovascular: Negative for chest pain or palpitations.  Gastrointestinal: Negative for abdominal pain, no bowel changes.  Musculoskeletal: Negative for gait problem or joint swelling.  Skin: Negative for rash.  Neurological:  Negative for dizziness or headache.  No other specific complaints in a complete review of systems (except as listed in HPI above).   Objective  Vitals:   10/07/21 1055  BP: 110/68  Pulse: 100  Resp: 16  Temp: 99.1 F (37.3 C)  TempSrc: Oral  SpO2: 98%  Weight: 163 lb 6.4 oz (74.1 kg)  Height: 5' 4"  (1.626 m)    Body mass index is 28.05 kg/m.  Physical Exam Constitutional: Patient appears well-developed and well-nourished. No distress.  HENT: Head: Normocephalic and atraumatic. Ears: B TMs ok, no erythema or effusion; Nose: Nose normal. Mouth/Throat: Oropharynx is clear and moist. No oropharyngeal exudate.  Eyes: Conjunctivae and EOM are normal. Pupils are equal, round, and reactive to light. No scleral icterus.  Neck: Normal range of motion. Neck supple. No JVD present. No thyromegaly present.  Cardiovascular: Normal rate, regular rhythm and normal heart sounds.  No murmur heard. No BLE edema. Pulmonary/Chest: Effort normal and breath sounds normal. No respiratory distress. Abdominal: Soft. Bowel sounds are normal, no distension. There is no tenderness. no masses Breast: no lumps or masses, no nipple discharge or rashes Musculoskeletal: Normal range of motion, no joint effusions. No gross deformities Neurological: he is alert and oriented to person, place, and time. No cranial nerve deficit. Coordination, balance, strength, speech and  gait are normal.  Skin: Skin is warm and dry. No rash noted. No erythema.  Psychiatric: Patient has a normal mood and affect. behavior is normal. Judgment and thought content normal.     Fall Risk: Fall Risk  10/07/2021 03/26/2020 07/12/2019  Falls in the past year? 0 0 0  Number falls in past yr: 0 0 0  Injury with Fall? 0 0 0  Follow up Falls evaluation completed - -    Functional Status Survey: Is the patient deaf or have difficulty hearing?: No Does the patient have difficulty seeing, even when wearing glasses/contacts?: No Does the patient have difficulty concentrating, remembering, or making decisions?: No Does the patient have difficulty walking or climbing stairs?: No Does the patient have difficulty dressing or bathing?: No Does the patient have difficulty doing errands alone such as visiting a doctor's office or shopping?: No   Assessment & Plan  1. Annual physical exam  - MM 3D SCREEN BREAST BILATERAL; Future - Lipid panel - CBC with Differential/Platelet - COMPLETE METABOLIC PANEL WITH GFR - Hemoglobin A1c - TSH - HIV Antibody (routine testing w rflx) - Hepatitis C antibody - VITAMIN D 25 Hydroxy (Vit-D Deficiency, Fractures)  2. Need for Tdap vaccination  - Tdap vaccine greater than or equal to 7yo IM  3. Screening for diabetes mellitus  - COMPLETE METABOLIC PANEL WITH GFR - Hemoglobin A1c  4. Screening for cholesterol level  - Lipid panel - COMPLETE METABOLIC PANEL WITH GFR  5. Encounter for hepatitis C screening test for low risk patient  - Hepatitis C antibody  6. Screening for HIV without presence of risk factors  - HIV Antibody (routine testing w rflx)  7. Vitamin D deficiency  - VITAMIN D 25 Hydroxy (Vit-D Deficiency, Fractures)  8. Screening for deficiency anemia  - CBC with Differential/Platelet  9. Encounter for screening mammogram for malignant neoplasm of breast  - MM 3D SCREEN BREAST BILATERAL; Future   -USPSTF grade A and B  recommendations reviewed with patient; age-appropriate recommendations, preventive care, screening tests, etc discussed and encouraged; healthy living encouraged; see AVS for patient education given to patient -Discussed importance of 150 minutes of physical  activity weekly, eat two servings of fish weekly, eat one serving of tree nuts ( cashews, pistachios, pecans, almonds.Marland Kitchen) every other day, eat 6 servings of fruit/vegetables daily and drink plenty of water and avoid sweet beverages.

## 2021-10-08 LAB — CBC WITH DIFFERENTIAL/PLATELET
Absolute Monocytes: 203 cells/uL (ref 200–950)
Basophils Absolute: 20 cells/uL (ref 0–200)
Basophils Relative: 0.5 %
Eosinophils Absolute: 70 cells/uL (ref 15–500)
Eosinophils Relative: 1.8 %
HCT: 40.7 % (ref 35.0–45.0)
Hemoglobin: 13 g/dL (ref 11.7–15.5)
Lymphs Abs: 1439 cells/uL (ref 850–3900)
MCH: 26.7 pg — ABNORMAL LOW (ref 27.0–33.0)
MCHC: 31.9 g/dL — ABNORMAL LOW (ref 32.0–36.0)
MCV: 83.7 fL (ref 80.0–100.0)
MPV: 11.8 fL (ref 7.5–12.5)
Monocytes Relative: 5.2 %
Neutro Abs: 2168 cells/uL (ref 1500–7800)
Neutrophils Relative %: 55.6 %
Platelets: 247 10*3/uL (ref 140–400)
RBC: 4.86 10*6/uL (ref 3.80–5.10)
RDW: 13.6 % (ref 11.0–15.0)
Total Lymphocyte: 36.9 %
WBC: 3.9 10*3/uL (ref 3.8–10.8)

## 2021-10-08 LAB — TSH: TSH: 1.16 mIU/L

## 2021-10-08 LAB — COMPLETE METABOLIC PANEL WITH GFR
AG Ratio: 1.3 (calc) (ref 1.0–2.5)
ALT: 17 U/L (ref 6–29)
AST: 20 U/L (ref 10–30)
Albumin: 4.1 g/dL (ref 3.6–5.1)
Alkaline phosphatase (APISO): 44 U/L (ref 31–125)
BUN: 16 mg/dL (ref 7–25)
CO2: 24 mmol/L (ref 20–32)
Calcium: 9.3 mg/dL (ref 8.6–10.2)
Chloride: 105 mmol/L (ref 98–110)
Creat: 0.97 mg/dL (ref 0.50–0.99)
Globulin: 3.2 g/dL (calc) (ref 1.9–3.7)
Glucose, Bld: 86 mg/dL (ref 65–99)
Potassium: 4.1 mmol/L (ref 3.5–5.3)
Sodium: 139 mmol/L (ref 135–146)
Total Bilirubin: 0.5 mg/dL (ref 0.2–1.2)
Total Protein: 7.3 g/dL (ref 6.1–8.1)
eGFR: 75 mL/min/{1.73_m2} (ref >=60)

## 2021-10-08 LAB — HIV ANTIBODY (ROUTINE TESTING W REFLEX): HIV 1&2 Ab, 4th Generation: NONREACTIVE

## 2021-10-08 LAB — HEMOGLOBIN A1C
Hgb A1c MFr Bld: 5.6 % of total Hgb (ref <5.7)
Mean Plasma Glucose: 114 mg/dL
eAG (mmol/L): 6.3 mmol/L

## 2021-10-08 LAB — VITAMIN D 25 HYDROXY (VIT D DEFICIENCY, FRACTURES): Vit D, 25-Hydroxy: 35 ng/mL (ref 30–100)

## 2021-10-08 LAB — LIPID PANEL
Cholesterol: 181 mg/dL (ref <200)
HDL: 60 mg/dL (ref >=50)
LDL Cholesterol (Calc): 107 mg/dL (calc) — ABNORMAL HIGH
Non-HDL Cholesterol (Calc): 121 mg/dL (calc) (ref <130)
Total CHOL/HDL Ratio: 3 (calc) (ref <5.0)
Triglycerides: 54 mg/dL (ref <150)

## 2021-10-08 LAB — HEPATITIS C ANTIBODY
Hepatitis C Ab: NONREACTIVE
SIGNAL TO CUT-OFF: 0.11 (ref <1.00)

## 2021-11-15 ENCOUNTER — Ambulatory Visit
Admission: EM | Admit: 2021-11-15 | Discharge: 2021-11-15 | Disposition: A | Discharge status: Home / Self Care | Payer: BLUE CROSS/BLUE SHIELD | Attending: Family Medicine | Admitting: Family Medicine

## 2021-11-15 ENCOUNTER — Ambulatory Visit
Admission: RE | Admit: 2021-11-15 | Discharge: 2021-11-15 | Disposition: A | Discharge status: Home / Self Care | Payer: BLUE CROSS/BLUE SHIELD | Source: Ambulatory Visit | Attending: Nurse Practitioner | Admitting: Nurse Practitioner

## 2021-11-15 DIAGNOSIS — Z1231 Encounter for screening mammogram for malignant neoplasm of breast: Secondary | ICD-10-CM | POA: Diagnosis present

## 2021-11-15 DIAGNOSIS — R350 Frequency of micturition: Secondary | ICD-10-CM

## 2021-11-15 DIAGNOSIS — N898 Other specified noninflammatory disorders of vagina: Secondary | ICD-10-CM | POA: Diagnosis not present

## 2021-11-15 DIAGNOSIS — Z0000 Encounter for general adult medical examination without abnormal findings: Secondary | ICD-10-CM | POA: Diagnosis not present

## 2021-11-15 LAB — POCT URINE PREGNANCY: Preg Test, Ur: NEGATIVE

## 2021-11-15 LAB — POCT URINALYSIS DIP (MANUAL ENTRY)
Bilirubin, UA: NEGATIVE
Blood, UA: NEGATIVE
Glucose, UA: NEGATIVE mg/dL
Ketones, POC UA: NEGATIVE mg/dL
Leukocytes, UA: NEGATIVE
Nitrite, UA: NEGATIVE
Protein Ur, POC: NEGATIVE mg/dL
Spec Grav, UA: 1.015 (ref 1.010–1.025)
Urobilinogen, UA: 0.2 E.U./dL
pH, UA: 7 (ref 5.0–8.0)

## 2021-11-15 NOTE — ED Triage Notes (Signed)
Patient presents to Urgent Care with complaints of possible UTI. She c/o of dysuria, bladder fullness, and urinary frequency x 1.5 weeks. Treating symptoms with sitz baths. ?

## 2021-11-15 NOTE — Discharge Instructions (Signed)
Urine test is negative for bacteria. ?Pregnancy is negative. ?Keep follow-up with GYN for further evaluation. ?Continue Sitz baths ?

## 2021-11-15 NOTE — ED Provider Notes (Signed)
?[facility] ? ? ? ?CSN: [id number] ?Arrival date & time: 11/15/21  1009 ? ? ?  ? ?History   ?Chief Complaint ?No chief complaint on file. ? ? ?HPI ?Carly Bennett is a 41 y.o. female.  ? ?HPI ?Patient presents today for evaluation of possible UTI. Patient endorses recent bladder fullness, dysuria, and frequency ongoing x 1.5 weeks. ?She endorses chronic vaginitis and used PH gel to help with symptoms. She has been previously treated with metronidazole and boric acid without  at prefers to avoid use of any these agents to try and normalize her flora. She is scheduled to see her PCP in two weeks. She wanted to rule out a UTI. Denies flank pain, fever, nausea, and vomiting.  ? ?Past Medical History:  ?Diagnosis Date  ? Amenorrhea   ? Elevated cholesterol   ? Herpes genitalis   ? History of bacterial infection   ? Pre-diabetes   ? Vaginal Pap smear, abnormal   ? ? ?Patient Active Problem List  ? Diagnosis Date Noted  ? Chronic vaginitis 07/03/2017  ? Vaginal pain 09/19/2016  ? Pulmonary embolism ([facility]) 05/27/2013  ? ? ?Past Surgical History:  ?Procedure Laterality Date  ? HYDRADENITIS EXCISION  2008  ? TONSILLECTOMY AND ADENOIDECTOMY  2008  ? ? ?OB History   ? ? Gravida  ?2  ? Para  ?   ? Term  ?   ? Preterm  ?   ? AB  ?   ? Living  ?2  ?  ? ? SAB  ?   ? IAB  ?   ? Ectopic  ?   ? Multiple  ?   ? Live Births  ?2  ?   ?  ?  ? ? ? ?Home Medications   ? ?Prior to Admission medications   ?Medication Sig Start Date End Date Taking? Authorizing Provider  ?metroNIDAZOLE (FLAGYL) 500 MG tablet Continue tx 04/03/2020 500 mg PO BID x 7 d, then one pill PO 2 x a week for 6 weeks ?Patient not taking: Reported on 10/07/2021 03/28/20   Danelle Berry, PA-C  ? ? ?Family History ?Family History  ?Problem Relation Age of Onset  ? Diabetes Mother   ? Hypertension Mother   ? Diabetes Father   ? Hypertension Father   ? Diabetes Maternal Grandmother   ? Breast cancer Neg Hx   ? ? ?Social History ?Social History  ? ?Tobacco Use  ?  Smoking status: Never  ? Smokeless tobacco: Never  ?Vaping Use  ? Vaping Use: Never used  ?Substance Use Topics  ? Alcohol use: Not Currently  ? Drug use: No  ? ? ? ?Allergies   ?Other and Shellfish allergy ? ? ?Review of Systems ?Review of Systems ?Pertinent negatives listed in HPI  ? ?Physical Exam ?Triage Vital Signs ?ED Triage Vitals  ?Enc Vitals Group  ?   BP 11/15/21 1025 124/80  ?   Pulse Rate 11/15/21 1025 71  ?   Resp 11/15/21 1025 16  ?   Temp 11/15/21 1025 98.2 ?F (36.8 ?C)  ?   Temp Source 11/15/21 1025 Oral  ?   SpO2 11/15/21 1025 98 %  ?   Weight --   ?   Height --   ?   Head Circumference --   ?   Peak Flow --   ?   Pain Score 11/15/21 1026 0  ?   Pain Loc --   ?   Pain Edu? --   ?  Excl. in GC? --   ? ?No data found. ? ?Updated Vital Signs ?BP 124/80 (BP Location: Left Arm)   Pulse 71   Temp 98.2 ?F (36.8 ?C) (Oral)   Resp 16   LMP 11/04/2021   SpO2 98%  ? ?Visual Acuity ?Right Eye Distance:   ?Left Eye Distance:   ?Bilateral Distance:   ? ?Right Eye Near:   ?Left Eye Near:    ?Bilateral Near:    ? ?Physical Exam ?General appearance: Alert, well developed, well nourished, cooperative ?Head: Normocephalic, without obvious abnormality, atraumatic ?Respiratory: Respirations even and unlabored, normal respiratory rate ?Heart: Rate and rhythm normal. ?Abdomen: No CVA or lower abdominal pain or tenderness  ?Extremities: No gross deformities ?Skin: Skin color, texture, turgor normal. No rashes seen  ?Psych: Appropriate mood and affect. ?Neurologic: GCS 15, no acute neurological deficit present ?UC Treatments / Results  ?Labs ?(all labs ordered are listed, but only abnormal results are displayed) ?Labs Reviewed  ?POCT URINALYSIS DIP (MANUAL ENTRY)  ?POCT URINE PREGNANCY  ? ? ?EKG ? ? ?Radiology ?MM 3D SCREEN BREAST BILATERAL ? ?Result Date: 11/15/2021 ?CLINICAL DATA:  Screening. EXAM: DIGITAL SCREENING BILATERAL MAMMOGRAM WITH TOMOSYNTHESIS AND CAD TECHNIQUE: Bilateral screening digital craniocaudal and  mediolateral oblique mammograms were obtained. Bilateral screening digital breast tomosynthesis was performed. The images were evaluated with computer-aided detection. COMPARISON:  Previous exam(s). ACR Breast Density Category c: The breast tissue is heterogeneously dense, which may obscure small masses. FINDINGS: There are no findings suspicious for malignancy. IMPRESSION: No mammographic evidence of malignancy. A result letter of this screening mammogram will be mailed directly to the patient. RECOMMENDATION: Screening mammogram in one year. (Code:SM-B-01Y) BI-RADS CATEGORY  1: Negative. Electronically Signed   By: Amie Portland M.D.   On: 11/15/2021 13:18   ? ?Procedures ?Procedures (including critical care time) ? ?Medications Ordered in UC ?Medications - No data to display ? ?Initial Impression / Assessment and Plan / UC Course  ?I have reviewed the triage vital signs and the nursing notes. ? ?Pertinent labs & imaging results that were available during my care of the patient were reviewed by me and considered in my medical decision making (see chart for details). ? ?  ?UA unremarkable for UTI. ?Pregnancy negative. ?Patient declined metronidazole gel or pill.  ?She will continue sitz baths. ?If symptom worsen or do not improve return for evaluation. ?Final Clinical Impressions(s) / UC Diagnoses  ? ?Final diagnoses:  ?Vaginal irritation  ?Urine frequency  ? ? ? ?Discharge Instructions   ? ?  ?Urine test is negative for bacteria. ?Pregnancy is negative. ?Keep follow-up with GYN for further evaluation. ?Continue Sitz baths ? ? ? ? ?ED Prescriptions   ?None ?  ? ?PDMP not reviewed this encounter. ?  ?Bing Neighbors, FNP ?11/16/21 0848 ? ?

## 2021-11-19 ENCOUNTER — Telehealth: Payer: Self-pay | Admitting: Emergency Medicine

## 2021-11-20 NOTE — Telephone Encounter (Signed)
Per Dr. Lanny Cramp no indication for flagyl.  Reviewed with patient and she verbalized understanding of f/u ?

## 2021-12-05 ENCOUNTER — Encounter: Payer: Self-pay | Admitting: Nurse Practitioner

## 2021-12-05 ENCOUNTER — Other Ambulatory Visit: Payer: Self-pay

## 2021-12-05 ENCOUNTER — Other Ambulatory Visit (HOSPITAL_COMMUNITY)
Admission: RE | Admit: 2021-12-05 | Discharge: 2021-12-05 | Disposition: A | Discharge status: Home / Self Care | Payer: BLUE CROSS/BLUE SHIELD | Source: Ambulatory Visit | Attending: Nurse Practitioner | Admitting: Nurse Practitioner

## 2021-12-05 ENCOUNTER — Ambulatory Visit (INDEPENDENT_AMBULATORY_CARE_PROVIDER_SITE_OTHER): Payer: BLUE CROSS/BLUE SHIELD | Admitting: Nurse Practitioner

## 2021-12-05 VITALS — BP 118/76 | HR 99 | Temp 98.5°F | Resp 16 | Ht 64.0 in | Wt 165.5 lb

## 2021-12-05 DIAGNOSIS — N898 Other specified noninflammatory disorders of vagina: Secondary | ICD-10-CM | POA: Insufficient documentation

## 2021-12-05 LAB — POCT URINALYSIS DIPSTICK
Bilirubin, UA: NEGATIVE
Blood, UA: NEGATIVE
Glucose, UA: NEGATIVE
Ketones, UA: NEGATIVE
Leukocytes, UA: NEGATIVE
Nitrite, UA: NEGATIVE
Protein, UA: NEGATIVE
Spec Grav, UA: 1.02 (ref 1.010–1.025)
Urobilinogen, UA: 0.2 E.U./dL
pH, UA: 5 (ref 5.0–8.0)

## 2021-12-05 NOTE — Progress Notes (Signed)
? ?BP 118/76   Pulse 99   Temp 98.5 ?F (36.9 ?C) (Oral)   Resp 16   Ht 5\' 4"  (1.626 m)   Wt 165 lb 8 oz (75.1 kg)   SpO2 97%   BMI 28.41 kg/m?   ? ?Subjective:  ? ? Patient ID: Carly Bennett, female    DOB: July 03, 1981, 41 y.o.   MRN: [id number] ? ?HPI: ?Carly Bennett is a 41 y.o. female ? ?Chief Complaint  ?Patient presents with  ? Vaginal Discharge  ?  Odor, burning  ? ?Vaginal discharge/odor: She says that she has been dealing with this off and on for a while.  She says she has been to urgent care and her GYN. She says she has vaginal discharge, fishy odor and lower abdominal pressure.  She has been using sitz baths, boric acid, metronidazole. She denies any dysuria, urinary frequency or urgency.  She says that most recently she was seen at urgent care on 11/15/2021. She was offered metronidazole gel but patient declined.  Plan of care at that time was to follow up with GYN and continue sitz baths. She followed up with GYN on 11/22/2021 She was prescribed flagyl and doxy.  Patient states she took the doxycycline, but only took the Flagyl for couple days.  She has been diagnosed with chronic BV.  Last year she was trialed once weekly MetroGel for prevention strategy but only took 1-2 doses because it makes her feel dry with white discharge.  She has been douching with peroxide and has been taking herbal supplements as well to help with symptoms.  Previous GYN did refer patient to uro-GYN.  Patient has not made that appointment yet.  Patient continues to state that she does not have bacterial vaginosis.  Patient also was positive for  patient reports that she would like to go to a different GYN office.  Pap was done on 11/22/2021.  She was positive for HPV.  He is supposed to get a colposcopy.  Does not want to go back to that OB/GYN office.  Will place referral to [facility].  Did obtain urine sample which was negative for infection, and vaginal swab.  ? ? ?Relevant past medical, surgical, family and  social history reviewed and updated as indicated. Interim medical history since our last visit reviewed. ?Allergies and medications reviewed and updated. ? ?Review of Systems ? ?Constitutional: Negative for fever or weight change.  ?Respiratory: Negative for cough and shortness of breath.   ?Cardiovascular: Negative for chest pain or palpitations.  ?Gastrointestinal: Negative for abdominal pain, no bowel changes.  ?GU: positive vaginal discharge and odor ?Musculoskeletal: Negative for gait problem or joint swelling.  ?Skin: Negative for rash.  ?Neurological: Negative for dizziness or headache.  ?No other specific complaints in a complete review of systems (except as listed in HPI above).  ? ?   ?Objective:  ?  ?BP 118/76   Pulse 99   Temp 98.5 ?F (36.9 ?C) (Oral)   Resp 16   Ht 5\' 4"  (1.626 m)   Wt 165 lb 8 oz (75.1 kg)   SpO2 97%   BMI 28.41 kg/m?   ?Wt Readings from Last 3 Encounters:  ?12/05/21 165 lb 8 oz (75.1 kg)  ?10/07/21 163 lb 6.4 oz (74.1 kg)  ?03/26/20 173 lb (78.5 kg)  ?  ?Physical Exam ? ?Constitutional: Patient appears well-developed and well-nourished. No distress.  ?HEENT: head atraumatic, normocephalic, pupils equal and reactive to light,  neck supple, throat within normal  limits ?Cardiovascular: Normal rate, regular rhythm and normal heart sounds.  No murmur heard. No BLE edema. ?Pulmonary/Chest: Effort normal and breath sounds normal. No respiratory distress. ?Abdominal: Soft.  There is no tenderness. ?Psychiatric: Patient has a normal mood and affect. behavior is normal. Judgment and thought content normal.  ?Results for orders placed or performed in visit on 12/05/21  ?POCT urinalysis dipstick  ?Result Value Ref Range  ? Color, UA yellow   ? Clarity, UA clear   ? Glucose, UA Negative Negative  ? Bilirubin, UA negative   ? Ketones, UA negative   ? Spec Grav, UA 1.020 1.010 - 1.025  ? Blood, UA negative   ? pH, UA 5.0 5.0 - 8.0  ? Protein, UA Negative Negative  ? Urobilinogen, UA 0.2 0.2  or 1.0 E.U./dL  ? Nitrite, UA negative   ? Leukocytes, UA Negative Negative  ? Appearance clear   ? Odor none   ? ?   ?Assessment & Plan:  ? ?Problem List Items Addressed This Visit   ? ?  ? Other  ? Vaginal discharge - Primary  ?  Patient was seen at urgent care and with her GYN.  She was being treated for bacterial vaginosis and cervicitis.  She was given prescription for Flagyl and doxycycline patient only took Flagyl for 2 days.  She continues to complain of vaginal discharge.  Obtained urine sample which was negative, obtained vaginal swab, awaiting results.  Will send treatment and pending on results.  Placed referral for GYN, patient requests a new one.  Discussed with patient that she needs to make her appointment with her uro-GYN. ? ?  ?  ? Relevant Orders  ? Cervicovaginal ancillary only  ? POCT urinalysis dipstick (Completed)  ? Ambulatory referral to Gynecology  ?  ? ?Follow up plan: ?Return if symptoms worsen or fail to improve. ? ? ? ? ? ?

## 2021-12-05 NOTE — Assessment & Plan Note (Signed)
Patient was seen at urgent care and with her GYN.  She was being treated for bacterial vaginosis and cervicitis.  She was given prescription for Flagyl and doxycycline patient only took Flagyl for 2 days.  She continues to complain of vaginal discharge.  Obtained urine sample which was negative, obtained vaginal swab, awaiting results.  Will send treatment and pending on results.  Placed referral for GYN, patient requests a new one.  Discussed with patient that she needs to make her appointment with her uro-GYN. ?

## 2021-12-06 LAB — CERVICOVAGINAL ANCILLARY ONLY
Bacterial Vaginitis (gardnerella): NEGATIVE
Candida Glabrata: NEGATIVE
Candida Vaginitis: NEGATIVE
Chlamydia: NEGATIVE
Comment: NEGATIVE
Comment: NEGATIVE
Comment: NEGATIVE
Comment: NEGATIVE
Comment: NEGATIVE
Comment: NORMAL
Neisseria Gonorrhea: NEGATIVE
Trichomonas: NEGATIVE

## 2022-01-13 ENCOUNTER — Encounter: Payer: Self-pay | Admitting: Obstetrics

## 2022-03-14 ENCOUNTER — Encounter: Payer: Self-pay | Admitting: Obstetrics

## 2022-04-15 ENCOUNTER — Encounter: Payer: BLUE CROSS/BLUE SHIELD | Admitting: Obstetrics and Gynecology

## 2022-04-25 ENCOUNTER — Encounter: Payer: Self-pay | Admitting: Obstetrics

## 2022-05-20 IMAGING — MG MM DIGITAL SCREENING BILAT W/ TOMO AND CAD
8 series · 8 of 24 positions shown · non-contrast
Comparison: Previous exam(s).

CLINICAL DATA: Screening.

EXAM:
DIGITAL SCREENING BILATERAL MAMMOGRAM WITH TOMOSYNTHESIS AND CAD
TECHNIQUE: Bilateral screening digital craniocaudal and mediolateral oblique
mammograms were obtained. Bilateral screening digital breast
tomosynthesis was performed. The images were evaluated with
computer-aided detection.

[L CC synth-2D]
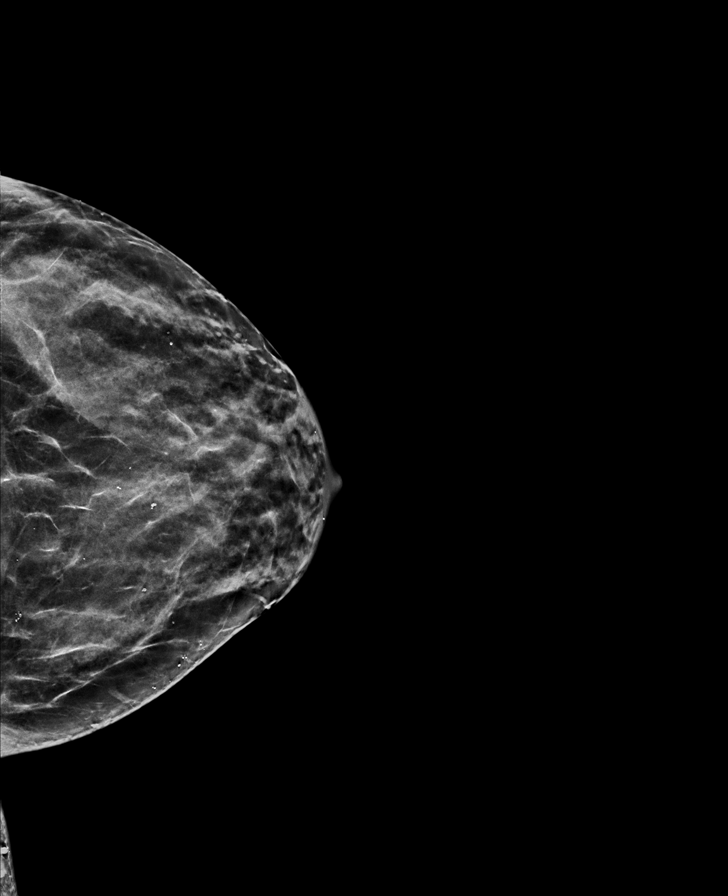

[L MLO synth-2D]
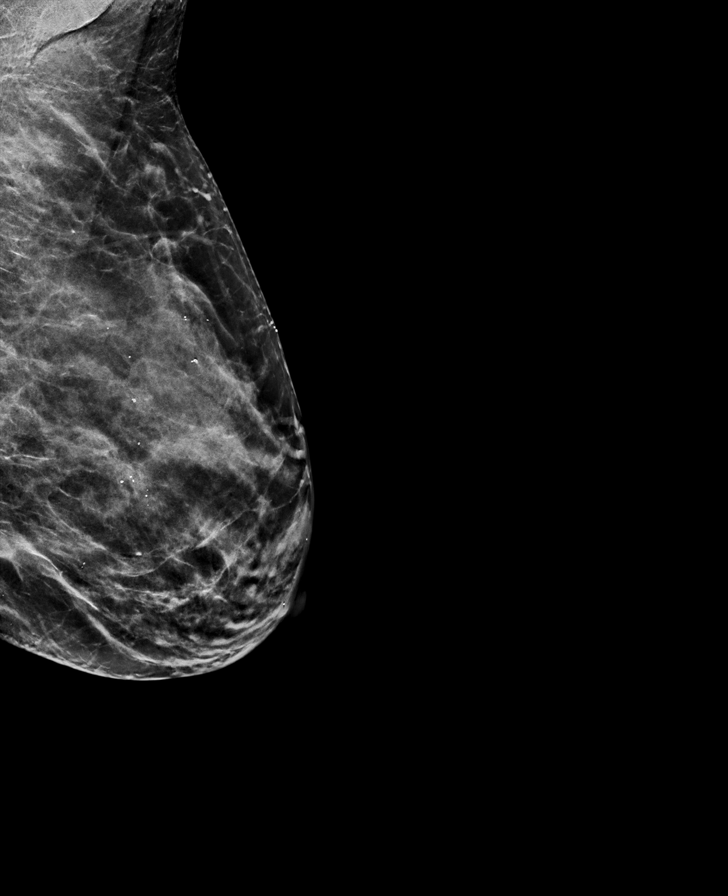

[R MLO synth-2D]
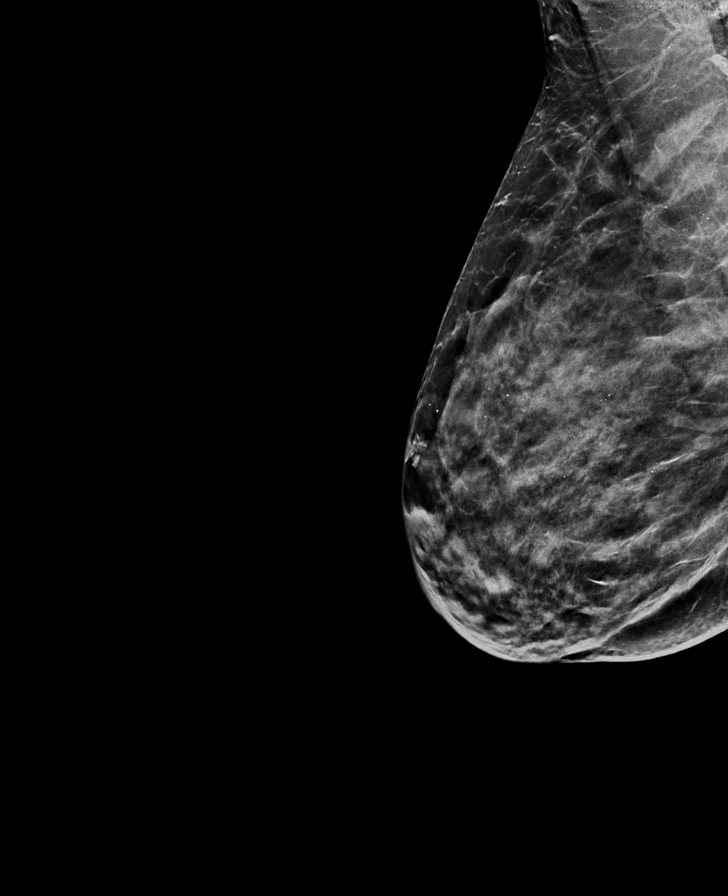

[R CC synth-2D]
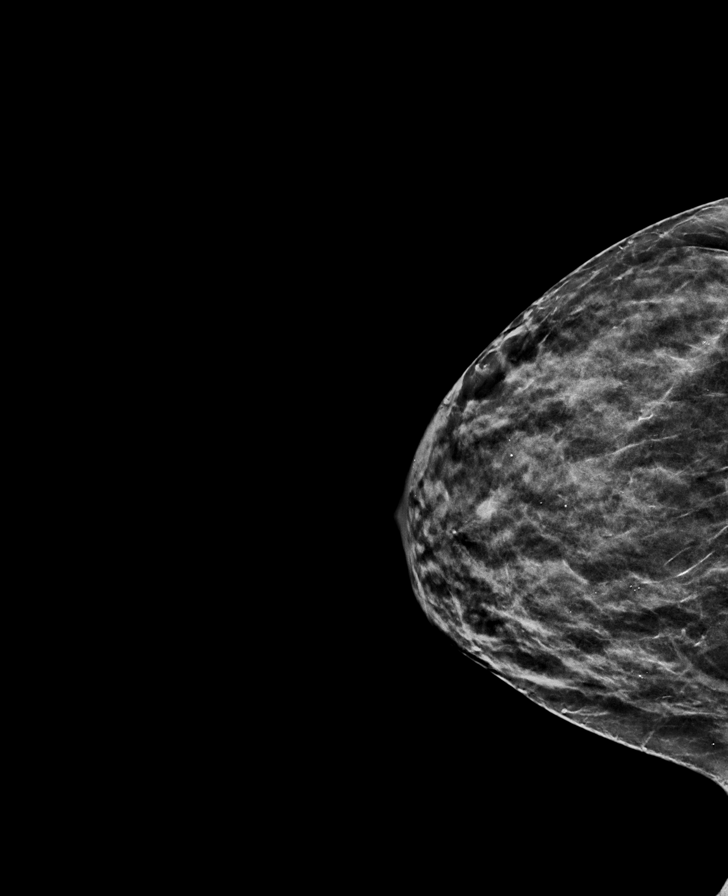

[L MLO tomo · tomo slice 32/63.0]
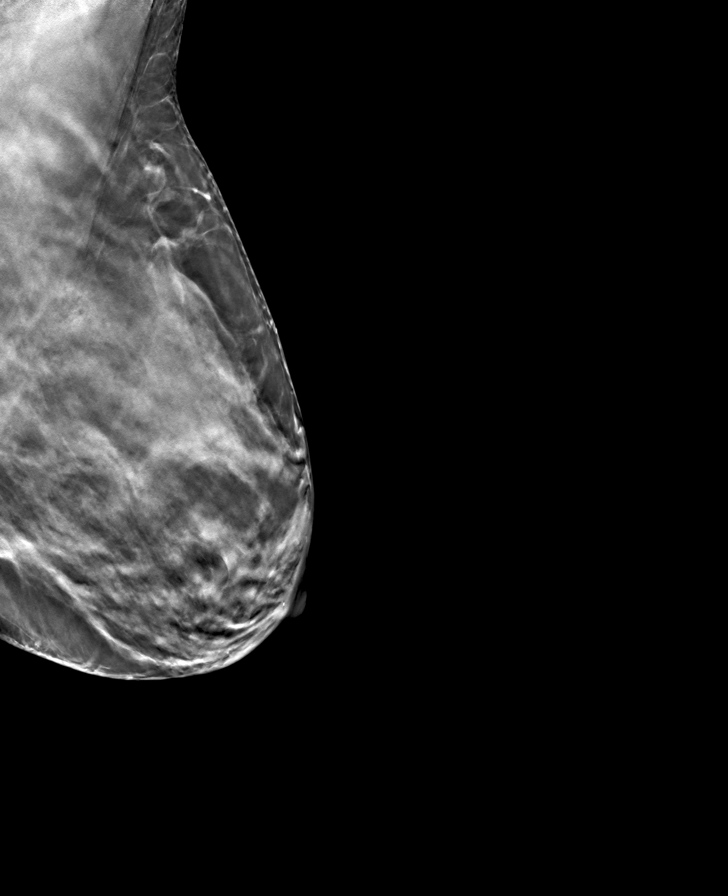

[R CC tomo · tomo slice 28/55.0]
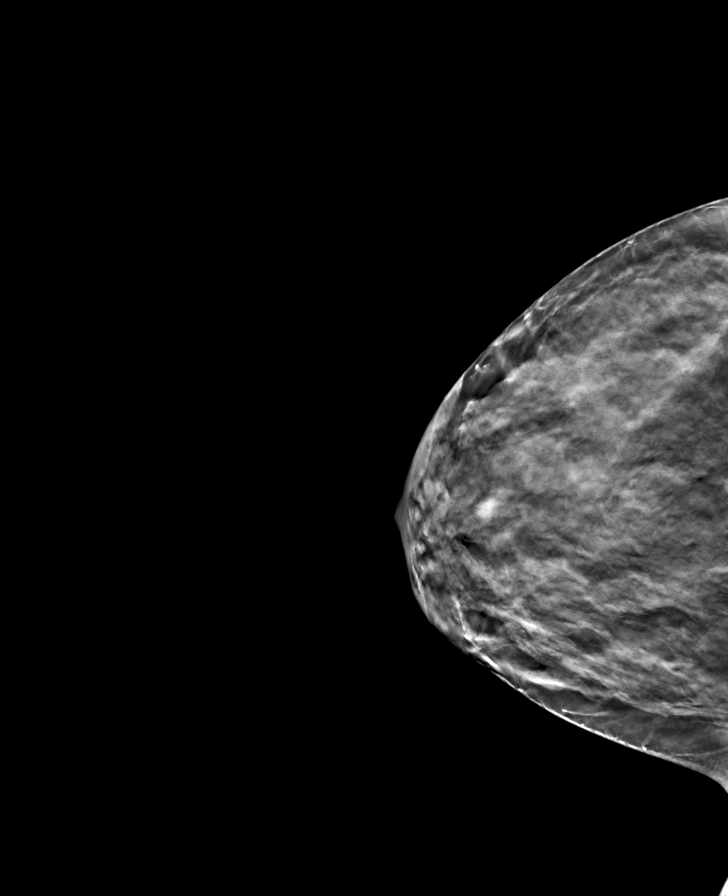

[L CC tomo · tomo slice 29/57.0]
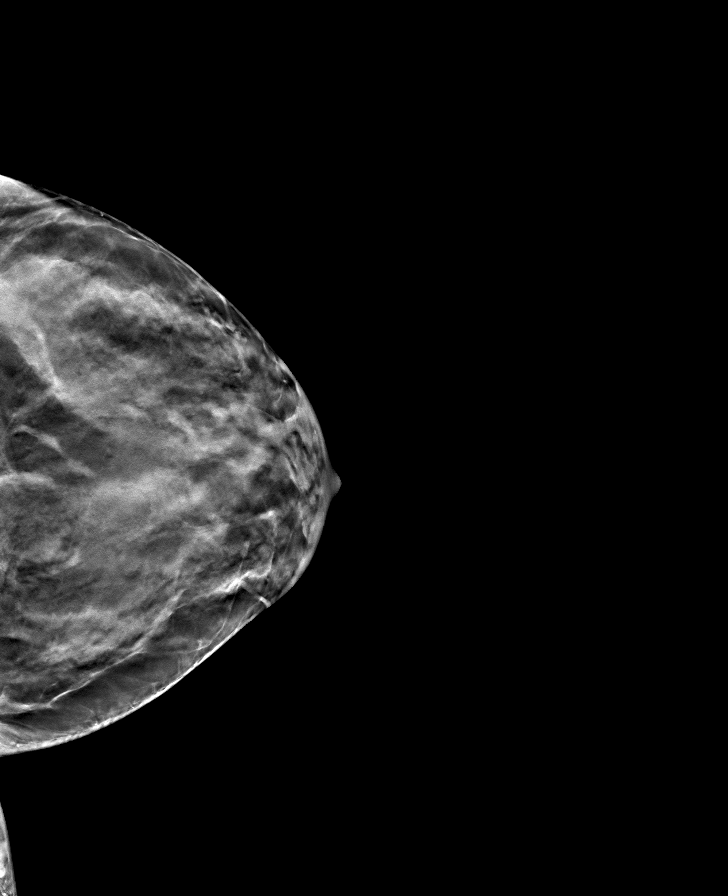

[R MLO tomo · tomo slice 30/59.0]
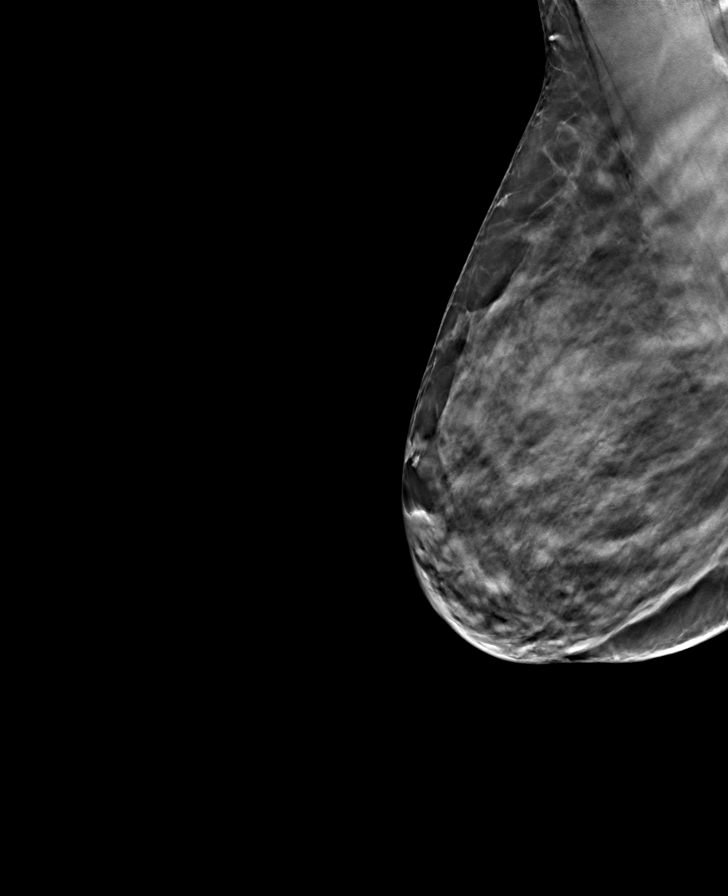

[8 of 24 positions shown; findings below may reference images not displayed]

ACR Breast Density Category c: The breast tissue is heterogeneously
dense, which may obscure small masses.
FINDINGS: In the right breast, a possible mass warrants further evaluation. In
the left breast, no findings suspicious for malignancy.
IMPRESSION: Further evaluation is suggested for a possible mass in the right
breast.

RECOMMENDATION:
Diagnostic mammogram and possibly ultrasound of the right breast.
(Code:82-G-AAO)

The patient will be contacted regarding the findings, and additional
imaging will be scheduled.

BI-RADS CATEGORY  0: Incomplete. Need additional imaging evaluation
and/or prior mammograms for comparison.

## 2022-06-02 ENCOUNTER — Encounter: Payer: Self-pay | Admitting: Obstetrics

## 2022-06-02 ENCOUNTER — Ambulatory Visit (INDEPENDENT_AMBULATORY_CARE_PROVIDER_SITE_OTHER): Payer: Self-pay | Admitting: Obstetrics

## 2022-06-02 ENCOUNTER — Other Ambulatory Visit (HOSPITAL_COMMUNITY)
Admission: RE | Admit: 2022-06-02 | Discharge: 2022-06-02 | Disposition: A | Discharge status: Home / Self Care | Payer: Self-pay | Source: Ambulatory Visit | Attending: Obstetrics | Admitting: Obstetrics

## 2022-06-02 VITALS — Ht 64.0 in | Wt 173.0 lb

## 2022-06-02 DIAGNOSIS — Z113 Encounter for screening for infections with a predominantly sexual mode of transmission: Secondary | ICD-10-CM

## 2022-06-02 DIAGNOSIS — N761 Subacute and chronic vaginitis: Secondary | ICD-10-CM

## 2022-06-02 DIAGNOSIS — Z3009 Encounter for other general counseling and advice on contraception: Secondary | ICD-10-CM

## 2022-06-02 MED ORDER — NORETHIN ACE-ETH ESTRAD-FE 1-20 MG-MCG PO TABS: 1.0000 | ORAL_TABLET | Freq: Every day | ORAL | 11 refills | Status: DC

## 2022-06-02 NOTE — Progress Notes (Signed)
Obstetrics & Gynecology Office Visit   Chief Complaint:  Chief Complaint  Patient presents with  . Referral  . Vaginitis    Chronic vaginitis issues constantly    History of Present Illness: Carly Bennett presents for evaluation of recurrent vaginal odor. A chart review reveals she has been seen by a number of providers with this complaint   Review of Systems:  ROS   Past Medical History:  Past Medical History:  Diagnosis Date  . Amenorrhea   . Elevated cholesterol   . Herpes genitalis   . History of bacterial infection   . Pre-diabetes   . Vaginal Pap smear, abnormal     Past Surgical History:  Past Surgical History:  Procedure Laterality Date  . HYDRADENITIS EXCISION  2008  . TONSILLECTOMY AND ADENOIDECTOMY  2008    Gynecologic History: Patient's last menstrual period was 05/20/2022 (exact date).  Obstetric History: [id number]  Family History:  Family History  Problem Relation Age of Onset  . Diabetes Mother   . Hypertension Mother   . Diabetes Father   . Hypertension Father   . Diabetes Maternal Grandmother   . Breast cancer Neg Hx     Social History:  Social History   Socioeconomic History  . Marital status: Single    Spouse name: Not on file  . Number of children: 2  . Years of education: 67  . Highest education level: Associate degree: occupational, Scientist, product/process development, or vocational program  Occupational History  . Not on file  Tobacco Use  . Smoking status: Never  . Smokeless tobacco: Never  Vaping Use  . Vaping Use: Never used  Substance and Sexual Activity  . Alcohol use: Not Currently  . Drug use: No  . Sexual activity: Yes    Birth control/protection: None  Other Topics Concern  . Not on file  Social History Narrative  . Not on file   Social Determinants of Health   Financial Resource Strain: Low Risk  (10/07/2021)   Overall Financial Resource Strain (CARDIA)   . Difficulty of Paying Living Expenses: Not hard at all  Food Insecurity: No  Food Insecurity (10/07/2021)   Hunger Vital Sign   . Worried About Programme researcher, broadcasting/film/video in the Last Year: Never true   . Ran Out of Food in the Last Year: Never true  Transportation Needs: No Transportation Needs (10/07/2021)   PRAPARE - Transportation   . Lack of Transportation (Medical): No   . Lack of Transportation (Non-Medical): No  Physical Activity: Insufficiently Active (10/07/2021)   Exercise Vital Sign   . Days of Exercise per Week: 4 days   . Minutes of Exercise per Session: 30 min  Stress: No Stress Concern Present (10/07/2021)   [employer] of Occupational Health - Occupational Stress Questionnaire   . Feeling of Stress : Only a little  Social Connections: Moderately Integrated (10/07/2021)   Social Connection and Isolation Panel [NHANES]   . Frequency of Communication with Friends and Family: Twice a week   . Frequency of Social Gatherings with Friends and Family: Twice a week   . Attends Religious Services: 1 to 4 times per year   . Active Member of Clubs or Organizations: No   . Attends Banker Meetings: 1 to 4 times per year   . Marital Status: Never married  Intimate Partner Violence: Not At Risk (10/07/2021)   Humiliation, Afraid, Rape, and Kick questionnaire   . Fear of Current or Ex-Partner: No   .  Emotionally Abused: No   . Physically Abused: No   . Sexually Abused: No    Allergies:  Allergies  Allergen Reactions  . Other Anaphylaxis    Uncoded Allergy. Allergen: CRAB LEGS  . Shellfish Allergy Other (See Comments)    Medications: Prior to Admission medications   Medication Sig Start Date End Date Taking? Authorizing Provider  norethindrone-ethinyl estradiol-FE (JUNEL FE 1/20) 1-20 MG-MCG tablet Take 1 tablet by mouth daily. 06/02/22  Yes Imagene Riches, CNM    Physical Exam Vitals: There were no vitals filed for this visit. Patient's last menstrual period was 05/20/2022 (exact date).  Physical Exam   Assessment: 41 y.o. O2H4765 No  problem-specific Assessment & Plan notes found for this encounter.   Plan: Problem List Items Addressed This Visit       Genitourinary   Chronic vaginitis   Relevant Orders   Cervicovaginal ancillary only   Other Visit Diagnoses     Encounter for other general counseling or advice on contraception    -  Primary   Relevant Medications   norethindrone-ethinyl estradiol-FE (JUNEL FE 1/20) 1-20 MG-MCG tablet   Screen for STD (sexually transmitted disease)       Relevant Orders   Cervicovaginal ancillary only

## 2022-06-03 LAB — CERVICOVAGINAL ANCILLARY ONLY
Bacterial Vaginitis (gardnerella): NEGATIVE
Candida Glabrata: NEGATIVE
Candida Vaginitis: NEGATIVE
Chlamydia: NEGATIVE
Comment: NEGATIVE
Comment: NEGATIVE
Comment: NEGATIVE
Comment: NEGATIVE
Comment: NEGATIVE
Comment: NORMAL
Neisseria Gonorrhea: NEGATIVE
Trichomonas: NEGATIVE

## 2022-07-08 ENCOUNTER — Ambulatory Visit: Payer: Self-pay | Admitting: Obstetrics and Gynecology

## 2022-08-19 ENCOUNTER — Ambulatory Visit: Payer: Self-pay | Admitting: Obstetrics and Gynecology

## 2022-11-21 NOTE — Progress Notes (Deleted)
ANNUAL PREVENTATIVE CARE GYNECOLOGY  ENCOUNTER NOTE  Subjective:       Carly Bennett is a 42 y.o. G75P2002 female here for a routine annual gynecologic exam. The patient {is/is not/has never been:13135} sexually active. The patient {is/is not:13135} taking hormone replacement therapy. {post-men bleed:13152::"Patient denies post-menopausal vaginal bleeding."} The patient wears seatbelts: {yes/no:311178}. The patient participates in regular exercise: {yes/no/not asked:9010}. Has the patient ever been transfused or tattooed?: {yes/no/not asked:9010}. The patient reports that there {is/is not:9024} domestic violence in her life.  Current complaints: 1.  ***    Gynecologic History No LMP recorded. Contraception: {method:5051} Last Pap: 03/25/17. Results were: normal Last mammogram: 11/15/21. Results were: normal Last Colonoscopy:  Last Dexa Scan:    Obstetric History OB History  Gravida Para Term Preterm AB Living  4 2 2     2   SAB IAB Ectopic Multiple Live Births          2    # Outcome Date GA Lbr Len/2nd Weight Sex Delivery Anes PTL Lv  4 Gravida 2000    F Vag-Spont   LIV  3 Gravida 1998    M Vag-Spont   LIV  2 Term           1 Term             Past Medical History:  Diagnosis Date   Amenorrhea    Elevated cholesterol    Herpes genitalis    History of bacterial infection    Pre-diabetes    Vaginal Pap smear, abnormal     Family History  Problem Relation Age of Onset   Diabetes Mother    Hypertension Mother    Diabetes Father    Hypertension Father    Diabetes Maternal Grandmother    Breast cancer Neg Hx     Past Surgical History:  Procedure Laterality Date   HYDRADENITIS EXCISION  2008   TONSILLECTOMY AND ADENOIDECTOMY  2008    Social History   Socioeconomic History   Marital status: Single    Spouse name: Not on file   Number of children: 2   Years of education: 12   Highest education level: Associate degree: occupational, Scientist, product/process development, or  vocational program  Occupational History   Not on file  Tobacco Use   Smoking status: Never   Smokeless tobacco: Never  Vaping Use   Vaping Use: Never used  Substance and Sexual Activity   Alcohol use: Not Currently   Drug use: No   Sexual activity: Yes    Birth control/protection: None  Other Topics Concern   Not on file  Social History Narrative   Not on file   Social Determinants of Health   Financial Resource Strain: Low Risk  (10/07/2021)   Overall Financial Resource Strain (CARDIA)    Difficulty of Paying Living Expenses: Not hard at all  Food Insecurity: No Food Insecurity (10/07/2021)   Hunger Vital Sign    Worried About Running Out of Food in the Last Year: Never true    Ran Out of Food in the Last Year: Never true  Transportation Needs: No Transportation Needs (10/07/2021)   PRAPARE - Administrator, Civil Service (Medical): No    Lack of Transportation (Non-Medical): No  Physical Activity: Insufficiently Active (10/07/2021)   Exercise Vital Sign    Days of Exercise per Week: 4 days    Minutes of Exercise per Session: 30 min  Stress: No Stress Concern Present (10/07/2021)   [place]  [employer]ational [employer] - Occupational Stress Questionnaire    Feeling of Stress : Only a little  Social Connections: Moderately Integrated (10/07/2021)   Social Connection and Isolation Panel [NHANES]    Frequency of Communication with Friends and Family: Twice a week    Frequency of Social Gatherings with Friends and Family: Twice a week    Attends Religious Services: 1 to 4 times per year    Active Member of [employer] or Organizations: No    Attends Banker Meetings: 1 to 4 times per year    Marital Status: Never married  Intimate Partner Violence: Not At Risk (10/07/2021)   Humiliation, Afraid, Rape, and Kick questionnaire    Fear of Current or Ex-Partner: No    Emotionally Abused: No    Physically Abused: No    Sexually Abused: No    Current Outpatient  Medications on File Prior to Visit  Medication Sig Dispense Refill   norethindrone-ethinyl estradiol-FE (JUNEL FE 1/20) 1-20 MG-MCG tablet Take 1 tablet by mouth daily. 28 tablet 11   No current facility-administered medications on file prior to visit.    Allergies  Allergen Reactions   Other Anaphylaxis    Uncoded Allergy. Allergen: CRAB LEGS   Shellfish Allergy Other (See Comments)      Review of Systems ROS Review of Systems - General ROS: negative for - chills, fatigue, fever, hot flashes, night sweats, weight gain or weight loss Psychological ROS: negative for - anxiety, decreased libido, depression, mood swings, physical abuse or sexual abuse Ophthalmic ROS: negative for - blurry vision, eye pain or loss of vision ENT ROS: negative for - headaches, hearing change, visual changes or vocal changes Allergy and Immunology ROS: negative for - hives, itchy/watery eyes or seasonal allergies Hematological and Lymphatic ROS: negative for - bleeding problems, bruising, swollen lymph nodes or weight loss Endocrine ROS: negative for - galactorrhea, hair pattern changes, hot flashes, malaise/lethargy, mood swings, palpitations, polydipsia/polyuria, skin changes, temperature intolerance or unexpected weight changes Breast ROS: negative for - new or changing breast lumps or nipple discharge Respiratory ROS: negative for - cough or shortness of breath Cardiovascular ROS: negative for - chest pain, irregular heartbeat, palpitations or shortness of breath Gastrointestinal ROS: no abdominal pain, change in bowel habits, or black or bloody stools Genito-Urinary ROS: no dysuria, trouble voiding, or hematuria Musculoskeletal ROS: negative for - joint pain or joint stiffness Neurological ROS: negative for - bowel and bladder control changes Dermatological ROS: negative for rash and skin lesion changes   Objective:   There were no vitals taken for this visit. CONSTITUTIONAL: Well-developed,  well-nourished female in no acute distress.  PSYCHIATRIC: Normal mood and affect. Normal behavior. Normal judgment and thought content. NEUROLGIC: Alert and oriented to person, place, and time. Normal muscle tone coordination. No cranial nerve deficit noted. HENT:  Normocephalic, atraumatic, External right and left ear normal. Oropharynx is clear and moist EYES: Conjunctivae and EOM are normal. Pupils are equal, round, and reactive to light. No scleral icterus.  NECK: Normal range of motion, supple, no masses.  Normal thyroid.  SKIN: Skin is warm and dry. No rash noted. Not diaphoretic. No erythema. No pallor. CARDIOVASCULAR: Normal heart rate noted, regular rhythm, no murmur. RESPIRATORY: Clear to auscultation bilaterally. Effort and breath sounds normal, no problems with respiration noted. BREASTS: Symmetric in size. No masses, skin changes, nipple drainage, or lymphadenopathy. ABDOMEN: Soft, normal bowel sounds, no distention noted.  No tenderness, rebound or guarding.  BLADDER: Normal PELVIC:  Bladder {:  311640}  Urethra: {:311719}  Vulva: {:311722}  Vagina: {:311643}  Cervix: {:311644}  Uterus: {:311718}  Adnexa: {:311645}  RV: {Blank multiple:19196::"External Exam NormaI","No Rectal Masses","Normal Sphincter tone"}  MUSCULOSKELETAL: Normal range of motion. No tenderness.  No cyanosis, clubbing, or edema.  2+ distal pulses. LYMPHATIC: No Axillary, Supraclavicular, or Inguinal Adenopathy.   Labs: Lab Results  Component Value Date   WBC 3.9 10/07/2021   HGB 13.0 10/07/2021   HCT 40.7 10/07/2021   MCV 83.7 10/07/2021   PLT 247 10/07/2021    Lab Results  Component Value Date   CREATININE 0.97 10/07/2021   BUN 16 10/07/2021   NA 139 10/07/2021   K 4.1 10/07/2021   CL 105 10/07/2021   CO2 24 10/07/2021    Lab Results  Component Value Date   ALT 17 10/07/2021   AST 20 10/07/2021   ALKPHOS 49 03/25/2017   BILITOT 0.5 10/07/2021    Lab Results  Component Value Date    CHOL 181 10/07/2021   HDL 60 10/07/2021   LDLCALC 107 (H) 10/07/2021   TRIG 54 10/07/2021   CHOLHDL 3.0 10/07/2021    Lab Results  Component Value Date   TSH 1.16 10/07/2021    Lab Results  Component Value Date   HGBA1C 5.6 10/07/2021     Assessment:   1. Breast cancer screening by mammogram   2. Well female exam with routine gynecological exam   3. Cervical cancer screening      Plan:  Pap: {Blank multiple:19196::"Pap, Reflex if ASCUS","Pap Co Test","GC/CT NAAT","Not needed","Not done"} Mammogram: {Blank multiple:19196::"***","Ordered","Not Ordered","Not Indicated"} Colon Screening:  {Blank multiple:19196::"***","Ordered","Not Ordered","Not Indicated"} Labs: {Blank multiple:19196::"Lipid 1","FBS","TSH","Hemoglobin A1C","Vit D Level""***"} Routine preventative health maintenance measures emphasized: {Blank multiple:19196::"Exercise/Diet/Weight control","Tobacco Warnings","Alcohol/Substance use risks","Stress Management","Peer Pressure Issues","Safe Sex"} COVID Vaccination status: Return to Clinic - 1 Year   Paula Compton CNM [facility]

## 2022-11-27 ENCOUNTER — Ambulatory Visit: Payer: Self-pay | Admitting: Obstetrics

## 2022-11-27 DIAGNOSIS — Z01419 Encounter for gynecological examination (general) (routine) without abnormal findings: Secondary | ICD-10-CM

## 2022-11-27 DIAGNOSIS — Z124 Encounter for screening for malignant neoplasm of cervix: Secondary | ICD-10-CM

## 2022-11-27 DIAGNOSIS — Z1231 Encounter for screening mammogram for malignant neoplasm of breast: Secondary | ICD-10-CM

## 2023-05-14 ENCOUNTER — Other Ambulatory Visit (HOSPITAL_COMMUNITY)
Admission: RE | Admit: 2023-05-14 | Discharge: 2023-05-14 | Disposition: A | Discharge status: Home / Self Care | Payer: BLUE CROSS/BLUE SHIELD | Source: Ambulatory Visit | Attending: Obstetrics | Admitting: Obstetrics

## 2023-05-14 ENCOUNTER — Ambulatory Visit: Payer: BLUE CROSS/BLUE SHIELD | Admitting: Obstetrics

## 2023-05-14 ENCOUNTER — Encounter: Payer: Self-pay | Admitting: Obstetrics

## 2023-05-14 VITALS — BP 110/78 | HR 72 | Ht 64.0 in | Wt 168.0 lb

## 2023-05-14 DIAGNOSIS — Z124 Encounter for screening for malignant neoplasm of cervix: Secondary | ICD-10-CM | POA: Insufficient documentation

## 2023-05-14 DIAGNOSIS — Z131 Encounter for screening for diabetes mellitus: Secondary | ICD-10-CM

## 2023-05-14 DIAGNOSIS — Z113 Encounter for screening for infections with a predominantly sexual mode of transmission: Secondary | ICD-10-CM

## 2023-05-14 DIAGNOSIS — E559 Vitamin D deficiency, unspecified: Secondary | ICD-10-CM

## 2023-05-14 DIAGNOSIS — Z01419 Encounter for gynecological examination (general) (routine) without abnormal findings: Secondary | ICD-10-CM | POA: Insufficient documentation

## 2023-05-14 DIAGNOSIS — Z13220 Encounter for screening for lipoid disorders: Secondary | ICD-10-CM

## 2023-05-14 DIAGNOSIS — Z1321 Encounter for screening for nutritional disorder: Secondary | ICD-10-CM

## 2023-05-14 DIAGNOSIS — Z1329 Encounter for screening for other suspected endocrine disorder: Secondary | ICD-10-CM

## 2023-05-15 LAB — LIPID PANEL
Chol/HDL Ratio: 3.4 {ratio} (ref 0.0–4.4)
Cholesterol, Total: 206 mg/dL — ABNORMAL HIGH (ref 100–199)
HDL: 60 mg/dL (ref >39)
LDL Chol Calc (NIH): 125 mg/dL — ABNORMAL HIGH (ref 0–99)
Triglycerides: 119 mg/dL (ref 0–149)
VLDL Cholesterol Cal: 21 mg/dL (ref 5–40)

## 2023-05-15 LAB — COMPREHENSIVE METABOLIC PANEL
ALT: 23 [IU]/L (ref 0–32)
AST: 25 [IU]/L (ref 0–40)
Albumin: 4.3 g/dL (ref 3.9–4.9)
Alkaline Phosphatase: 63 [IU]/L (ref 44–121)
BUN/Creatinine Ratio: 15 (ref 9–23)
BUN: 14 mg/dL (ref 6–24)
Bilirubin Total: 0.2 mg/dL (ref 0.0–1.2)
CO2: 24 mmol/L (ref 20–29)
Calcium: 9.5 mg/dL (ref 8.7–10.2)
Chloride: 103 mmol/L (ref 96–106)
Creatinine, Ser: 0.96 mg/dL (ref 0.57–1.00)
Globulin, Total: 2.5 g/dL (ref 1.5–4.5)
Glucose: 73 mg/dL (ref 70–99)
Potassium: 4.1 mmol/L (ref 3.5–5.2)
Sodium: 141 mmol/L (ref 134–144)
Total Protein: 6.8 g/dL (ref 6.0–8.5)
eGFR: 75 mL/min/{1.73_m2} (ref >59)

## 2023-05-15 LAB — TSH: TSH: 1.23 u[IU]/mL (ref 0.450–4.500)

## 2023-05-15 LAB — VITAMIN D 25 HYDROXY (VIT D DEFICIENCY, FRACTURES): Vit D, 25-Hydroxy: 78.8 ng/mL (ref 30.0–100.0)

## 2023-05-15 LAB — HEMOGLOBIN A1C
Est. average glucose Bld gHb Est-mCnc: 120 mg/dL
Hgb A1c MFr Bld: 5.8 % — ABNORMAL HIGH (ref 4.8–5.6)

## 2023-05-16 ENCOUNTER — Encounter: Payer: Self-pay | Admitting: Obstetrics

## 2023-05-18 LAB — CERVICOVAGINAL ANCILLARY ONLY
Bacterial Vaginitis (gardnerella): NEGATIVE
Candida Glabrata: NEGATIVE
Candida Vaginitis: NEGATIVE
Chlamydia: NEGATIVE
Comment: NEGATIVE
Comment: NEGATIVE
Comment: NEGATIVE
Comment: NEGATIVE
Comment: NEGATIVE
Comment: NORMAL
Neisseria Gonorrhea: NEGATIVE
Trichomonas: NEGATIVE

## 2023-05-19 LAB — CYTOLOGY - PAP
Comment: NEGATIVE
Diagnosis: NEGATIVE
High risk HPV: NEGATIVE

## 2023-05-21 ENCOUNTER — Other Ambulatory Visit: Payer: Self-pay | Admitting: Obstetrics

## 2023-05-21 DIAGNOSIS — Z1231 Encounter for screening mammogram for malignant neoplasm of breast: Secondary | ICD-10-CM

## 2023-05-26 NOTE — Progress Notes (Signed)
Gynecology Annual Exam  PCP: Danelle Berry, PA-C  Chief Complaint:  Chief Complaint  Patient presents with   Gynecologic Exam         History of Present Illness: Patient is a 42 y.o. [id number] presents for annual exam. The patient has no complaints today. She is due for a pap smear and would like testing fro infection and for some recurrent vaginal irritaion and at times, odor.  LMP: Patient's last menstrual period was 05/04/2023 (exact date). Average Interval: regular, 28 days Duration of flow: 4 days Heavy Menses: no Clots: no Intermenstrual Bleeding: no Postcoital Bleeding: no Dysmenorrhea: no   The patient is sexually active. She currently uses condoms for contraception. She denies dyspareunia.  The patient does perform self breast exams.  There is no notable family history of breast or ovarian cancer in her family.  The patient wears seatbelts: yes.   The patient has regular exercise: yes.    The patient denies current symptoms of depression.    Review of Systems: ROS  Past Medical History:  Patient Active Problem List   Diagnosis Date Noted Date Diagnosed   Vaginal discharge 12/05/2021    Chronic vaginitis 07/03/2017    Vaginal pain 09/19/2016    Pulmonary embolism (HCC) 05/27/2013     Formatting of this note might be different from the original. Mrs. Rooker comes back in today for followup of her pulmonary embolism from eight months ago. HISTORY OF PRESENT ILLNESS: Mrs. Kenaston is a 42 year old woman, who has been healthy all of her life. She delivered a baby last year - this was a C-section. She had a normal pregnancy. She did well until11/13 when she developed fairly sudden onset of substernal, severe chest pain. She went to the emergency room at [facility] where evaluation showed her to have a left lower lobe pulmonary embolus and a mildly elevated D-dimer. She was admitted to the hospital and started on Lovenox and Coumadin and has been managed over the last week  by [facility] in terms of her Coumadin. Symptoms, in terms of the chest pain, have resolved approximately 95%; she still feels a little uneasy in that area. During the workup at [facility], she had Dopplers bilaterally, which were negative. She had a workup, including a homocysteine, anticardiolipin antibodies, lupus anticoagulant, prothrombin gene mutation, factor V Leiden, hemoglobin, and platelet count, which were all within normal limits. It is unclear to me whether protein C, protein S, and antithrombin were not sent . The patient has no family history of clot. She had no history of varicosity. She had no history of trauma or dehydration prior to the event. She had delivered the baby three weeks prior to this and was ambulatory. She had no other pulmonary symptoms. No pleuritic-type chest pain, cough, or hemoptysis. She is tolerating the Coumadin quite well on 5 mg daily. INTERVAL HISTORYMrs. Houchens has been managed with anticoagulation by her primary care team and has done extremely well on anticoagulation for 7 months. She has had no problems with bleeding or bruising. She has had no further pleuritic type or substernal chest pain. She has had no leg pain or leg swelling. She has had no venous fullness or reflux. Strength and energy has been good. Appetite has been good. PAST MEDICAL HISTORY: Reviewed. There have been no new changes. REVIEW OF SYSTEMS: Cardiovascular: No chest pain, exertional shortness of breath, paroxysmal nocturnal dyspnea, or diaphoresis. Respiratory: No dyspnea, chest pain, cough, or hemoptysis. Gastrointestinal: No bowel changes, melena, hematochezia,  dyspepsia, or pain. PHYSICAL EXAMINATION: Limited exam today. Her legs show no evidence of edema, Homans sign, erythema, or abnormal varicosities. LABORATORY DATA: CBC: WBC: 3300. Hgb: 10.5. PLT: 195,000. MCV: 82. IMPRESSION/PLAN: The patient has postpartum pulmonary embolus, negative hypercoagulable workup with the  exception of protein C, protein S, and antithrombin III which were not drawn initially. We will take her off Coumadin today, bring her back in four weeks for testing of those three, and I will also check a D-dimer on her at that point as well as iron studies. If the protein C, protein S, and antithrombin III are negative, I would keep her off anticoagulation. She will probably need prophylaxis for her next pregnancy; this was discussed with the patient today. I will see her back in six weeks to go over the results of that testing.     Past Surgical History:  Past Surgical History:  Procedure Laterality Date   HYDRADENITIS EXCISION  2008   TONSILLECTOMY AND ADENOIDECTOMY  2008    Gynecologic History:  Patient's last menstrual period was 05/04/2023 (exact date). Contraception: condoms Last Pap: Results were: 2018 no abnormalities  Last mammogram: 11/2021 Results were: Elby Showers I  Obstetric History: [id number]  Family History:  Family History  Problem Relation Age of Onset   Diabetes Mother    Hypertension Mother    Diabetes Father    Hypertension Father    Diabetes Maternal Grandmother    Pancreatic cancer Maternal Grandmother        late 73s   Breast cancer Neg Hx     Social History:  Social History   Socioeconomic History   Marital status: Single    Spouse name: Not on file   Number of children: 2   Years of education: 12   Highest education level: Associate degree: occupational, Scientist, product/process development, or vocational program  Occupational History   Not on file  Tobacco Use   Smoking status: Never   Smokeless tobacco: Never  Vaping Use   Vaping status: Never Used  Substance and Sexual Activity   Alcohol use: Yes    Comment: soc   Drug use: No   Sexual activity: Yes    Birth control/protection: None, Condom  Other Topics Concern   Not on file  Social History Narrative   Not on file   Social Determinants of Health   Financial Resource Strain: Low Risk  (10/07/2021)   Overall  Financial Resource Strain (CARDIA)    Difficulty of Paying Living Expenses: Not hard at all  Food Insecurity: No Food Insecurity (10/07/2021)   Hunger Vital Sign    Worried About Running Out of Food in the Last Year: Never true    Ran Out of Food in the Last Year: Never true  Transportation Needs: No Transportation Needs (10/07/2021)   PRAPARE - Administrator, Civil Service (Medical): No    Lack of Transportation (Non-Medical): No  Physical Activity: Insufficiently Active (10/07/2021)   Exercise Vital Sign    Days of Exercise per Week: 4 days    Minutes of Exercise per Session: 30 min  Stress: No Stress Concern Present (10/07/2021)   [employer] of Occupational Health - Occupational Stress Questionnaire    Feeling of Stress : Only a little  Social Connections: Moderately Integrated (10/07/2021)   Social Connection and Isolation Panel [NHANES]    Frequency of Communication with Friends and Family: Twice a week    Frequency of Social Gatherings with Friends and Family: Twice a week  Attends Religious Services: 1 to 4 times per year    Active Member of Clubs or Organizations: No    Attends Banker Meetings: 1 to 4 times per year    Marital Status: Never married  Intimate Partner Violence: Not At Risk (10/07/2021)   Humiliation, Afraid, Rape, and Kick questionnaire    Fear of Current or Ex-Partner: No    Emotionally Abused: No    Physically Abused: No    Sexually Abused: No    Allergies:  Allergies  Allergen Reactions   Other Anaphylaxis    Uncoded Allergy. Allergen: CRAB LEGS   Shellfish Allergy Other (See Comments) and Anaphylaxis    Medications: Prior to Admission medications   Medication Sig Start Date End Date Taking? Authorizing Provider  mupirocin ointment (BACTROBAN) 2 % Apply topically. 12/30/22  Yes [provider]  nystatin cream (MYCOSTATIN) Apply topically. 12/30/22  Yes [provider]    Physical Exam Vitals: Blood  pressure 110/78, pulse 72, height 5\' 4"  (1.626 m), weight 168 lb (76.2 kg), last menstrual period 05/04/2023.  General: NAD HEENT: normocephalic, anicteric Thyroid: no enlargement, no palpable nodules Pulmonary: No increased work of breathing, CTAB Cardiovascular: RRR, distal pulses 2+ Breast: Breast symmetrical, no tenderness, no palpable nodules or masses, no skin or nipple retraction present, no nipple discharge.  No axillary or supraclavicular lymphadenopathy. Abdomen: NABS, soft, non-tender, non-distended.  Umbilicus without lesions.  No hepatomegaly, splenomegaly or masses palpable. No evidence of hernia  Genitourinary:  External: Normal external female genitalia.  Normal urethral meatus, normal Bartholin's and Skene's glands.    Vagina: Normal vaginal mucosa, no evidence of prolapse.    Cervix: Grossly normal in appearance, no bleeding  Uterus: Non-enlarged, mobile, normal contour.  No CMT  Adnexa: ovaries non-enlarged, no adnexal masses  Rectal: deferred  Lymphatic: no evidence of inguinal lymphadenopathy Extremities: no edema, erythema, or tenderness Neurologic: Grossly intact Psychiatric: mood appropriate, affect full  Female chaperone present for pelvic and breast  portions of the physical exam    Assessment: 42 y.o. [id number] routine annual exam  Plan: Problem List Items Addressed This Visit   None Visit Diagnoses     Encounter for annual routine gynecological examination    -  Primary   Relevant Orders   Cytology - PAP (Completed)   Cervicovaginal ancillary only (Completed)   Comp Met (CMET) (Completed)   Vitamin D (25 hydroxy) (Completed)   HgB A1c (Completed)   TSH (Completed)   Lipid panel (Completed)   Screening for cervical cancer       Relevant Orders   Cytology - PAP (Completed)   Screening for STD (sexually transmitted disease)       Relevant Orders   Cervicovaginal ancillary only (Completed)   Screening for hyperlipidemia       Relevant Orders    Lipid panel (Completed)   Screening for thyroid disorder       Relevant Orders   TSH (Completed)   Vitamin D deficiency       Screening for diabetes mellitus       Relevant Orders   Comp Met (CMET) (Completed)   HgB A1c (Completed)   Encounter for vitamin deficiency screening       Relevant Orders   Vitamin D (25 hydroxy) (Completed)       1) Mammogram - recommend yearly screening mammogram.  Mammogram Was ordered today   2) STI screening  wasoffered and accepted  3) ASCCP guidelines and rational discussed.  Patient opts for  every 3 years screening interval  4) Contraception - the patient is currently using  condoms.  She is happy with her current form of contraception and plans to continue  5) Colonoscopy -- Screening recommended starting at age 68 for average risk individuals, age 65 for individuals deemed at increased risk (including African Americans) and recommended to continue until age 47.  For patient age 45-85 individualized approach is recommended.  Gold standard screening is via colonoscopy, Cologuard screening is an acceptable alternative for patient unwilling or unable to undergo colonoscopy.  "Colorectal cancer screening for average?risk adults: 2018 guideline update from the American Cancer Society"CA: A Cancer Journal for Clinicians: Dec 31, 2016   6) Routine healthcare maintenance including cholesterol, diabetes screening discussed Ordered today  7) Return in about 1 year (around 05/13/2024) for annual.   Mirna Mires, CNM  05/26/2023 5:01 PM   05/26/2023, 5:01 PM

## 2023-06-03 ENCOUNTER — Ambulatory Visit
Admission: RE | Admit: 2023-06-03 | Discharge: 2023-06-03 | Disposition: A | Discharge status: Home / Self Care | Payer: BLUE CROSS/BLUE SHIELD | Source: Ambulatory Visit | Attending: Obstetrics | Admitting: Obstetrics

## 2023-06-03 DIAGNOSIS — Z1231 Encounter for screening mammogram for malignant neoplasm of breast: Secondary | ICD-10-CM | POA: Diagnosis present

## 2023-06-24 ENCOUNTER — Telehealth: Payer: Self-pay

## 2023-06-24 NOTE — Telephone Encounter (Signed)
[facility] VOICEMAIL: Patient requesting rx for Doxycyline for atrophic vaginitis. She states she spoke with Paula Compton about this at her annual exam 05/14/23.

## 2023-06-29 ENCOUNTER — Other Ambulatory Visit: Payer: Self-pay | Admitting: Obstetrics

## 2023-06-29 DIAGNOSIS — N761 Subacute and chronic vaginitis: Secondary | ICD-10-CM

## 2023-06-29 MED ORDER — DOXYCYCLINE HYCLATE 100 MG PO CAPS: 100.0000 mg | ORAL_CAPSULE | Freq: Two times a day (BID) | ORAL | 0 refills | Status: AC

## 2023-06-29 NOTE — Telephone Encounter (Signed)
Spoke with patient. She advised that she has had recurrent BV and taken enough metronidazole to last a life time. If was found previously that her BV was caused by fecal matter and the only thing that cleared her symptoms was a round of doxycycline. Advised will send to providers for review.

## 2023-07-01 NOTE — Telephone Encounter (Signed)
Patient aware. She p/u rx. She will f/u and schedule appointment with Dr. Valentino Saxon with any reoccurrence or new symptoms.

## 2023-07-18 ENCOUNTER — Ambulatory Visit
Admission: RE | Admit: 2023-07-18 | Discharge: 2023-07-18 | Disposition: A | Discharge status: Home / Self Care | Payer: Self-pay | Source: Ambulatory Visit | Attending: Emergency Medicine | Admitting: Emergency Medicine

## 2023-07-18 VITALS — BP 118/76 | HR 87 | Temp 98.1°F | Resp 18

## 2023-07-18 DIAGNOSIS — R1030 Lower abdominal pain, unspecified: Secondary | ICD-10-CM | POA: Diagnosis present

## 2023-07-18 DIAGNOSIS — R8290 Unspecified abnormal findings in urine: Secondary | ICD-10-CM | POA: Diagnosis present

## 2023-07-18 DIAGNOSIS — N898 Other specified noninflammatory disorders of vagina: Secondary | ICD-10-CM | POA: Diagnosis present

## 2023-07-18 LAB — POCT URINALYSIS DIP (MANUAL ENTRY)
Bilirubin, UA: NEGATIVE
Blood, UA: NEGATIVE
Glucose, UA: NEGATIVE mg/dL
Ketones, POC UA: NEGATIVE mg/dL
Leukocytes, UA: NEGATIVE
Nitrite, UA: NEGATIVE
Protein Ur, POC: NEGATIVE mg/dL
Spec Grav, UA: 1.025 (ref 1.010–1.025)
Urobilinogen, UA: 0.2 U/dL
pH, UA: 6 (ref 5.0–8.0)

## 2023-07-18 LAB — POCT URINE PREGNANCY: Preg Test, Ur: NEGATIVE

## 2023-07-18 NOTE — ED Triage Notes (Addendum)
Patient to Urgent Care with complaints of vaginal discharge/ dysuria/ malodorous urine/ lower abdominal discomfort. Denies any known fevers.  Reports symptoms started three days ago. Would like to rule out UTI/ BV/ STD.

## 2023-07-18 NOTE — ED Provider Notes (Signed)
Carly Bennett    CSN: [id number] Arrival date & time: 07/18/23  1419      History   Chief Complaint Chief Complaint  Patient presents with   Vaginal Discharge    Lower abdominal pain discomfort. Mild discharge. Mild odor to urine.(I would like to take out UTI, BV and STI) - Entered by patient    HPI Carly Bennett is a 42 y.o. female.  Patient presents with vaginal discharge, malodorous urine, lower abdominal pain x 3 days.  No fever, hematuria, flank pain, pelvic pain.  She requests urine testing and STD testing.  Her medical history includes recurrent chronic vaginitis.  Patient's gynecologist prescribed 7-day course of doxycycline on 06/29/2023.  The history is provided by the patient and medical records.    Past Medical History:  Diagnosis Date   Amenorrhea    Elevated cholesterol    Herpes genitalis    History of bacterial infection    Pre-diabetes    Vaginal Pap smear, abnormal     Patient Active Problem List   Diagnosis Date Noted   Vaginal discharge 12/05/2021   Chronic vaginitis 07/03/2017   Vaginal pain 09/19/2016   Pulmonary embolism (HCC) 05/27/2013    Past Surgical History:  Procedure Laterality Date   HYDRADENITIS EXCISION  2008   TONSILLECTOMY AND ADENOIDECTOMY  2008    OB History     Gravida  4   Para  0   Term  0   Preterm      AB  2   Living  2      SAB  2   IAB      Ectopic      Multiple      Live Births  2            Home Medications    Prior to Admission medications   Medication Sig Start Date End Date Taking? Authorizing Provider  doxycycline (VIBRAMYCIN) 100 MG capsule Take 1 capsule (100 mg total) by mouth 2 (two) times daily. Patient not taking: Reported on 07/18/2023 06/29/23   Mirna Mires, CNM  mupirocin ointment (BACTROBAN) 2 % Apply topically. 12/30/22   [provider]  nystatin cream (MYCOSTATIN) Apply topically. 12/30/22   [provider]    Family History Family  History  Problem Relation Age of Onset   Diabetes Mother    Hypertension Mother    Diabetes Father    Hypertension Father    Diabetes Maternal Grandmother    Pancreatic cancer Maternal Grandmother        late 18s   Breast cancer Neg Hx     Social History Social History   Tobacco Use   Smoking status: Never   Smokeless tobacco: Never  Vaping Use   Vaping status: Never Used  Substance Use Topics   Alcohol use: Yes    Comment: soc   Drug use: No     Allergies   Other and Shellfish allergy   Review of Systems Review of Systems  Constitutional:  Negative for chills and fever.  Gastrointestinal:  Positive for abdominal pain. Negative for constipation, diarrhea, nausea and vomiting.  Genitourinary:  Positive for dysuria and vaginal discharge. Negative for flank pain, frequency, hematuria and pelvic pain.     Physical Exam Triage Vital Signs ED Triage Vitals  Encounter Vitals Group     BP      Systolic BP Percentile      Diastolic BP Percentile      Pulse  Resp      Temp      Temp src      SpO2      Weight      Height      Head Circumference      Peak Flow      Pain Score      Pain Loc      Pain Education      Exclude from Growth Chart    No data found.  Updated Vital Signs BP 118/76   Pulse 87   Temp 98.1 F (36.7 C)   Resp 18   LMP 06/24/2023   SpO2 97%   Visual Acuity Right Eye Distance:   Left Eye Distance:   Bilateral Distance:    Right Eye Near:   Left Eye Near:    Bilateral Near:     Physical Exam Constitutional:      General: She is not in acute distress. HENT:     Mouth/Throat:     Mouth: Mucous membranes are moist.  Cardiovascular:     Rate and Rhythm: Normal rate and regular rhythm.  Pulmonary:     Effort: Pulmonary effort is normal. No respiratory distress.  Abdominal:     General: Bowel sounds are normal.     Palpations: Abdomen is soft.     Tenderness: There is no abdominal tenderness. There is no right CVA  tenderness, left CVA tenderness, guarding or rebound.  Skin:    General: Skin is warm and dry.  Neurological:     Mental Status: She is alert.      UC Treatments / Results  Labs (all labs ordered are listed, but only abnormal results are displayed) Labs Reviewed  POCT URINE PREGNANCY  POCT URINALYSIS DIP (MANUAL ENTRY)  CERVICOVAGINAL ANCILLARY ONLY    EKG   Radiology No results found.  Procedures Procedures (including critical care time)  Medications Ordered in UC Medications - No data to display  Initial Impression / Assessment and Plan / UC Course  I have reviewed the triage vital signs and the nursing notes.  Pertinent labs & imaging results that were available during my care of the patient were reviewed by me and considered in my medical decision making (see chart for details).    Vaginal discharge, malodorous urine, lower abdominal pain.  Afebrile and vital signs are stable.  Abdomen is soft and nontender with good bowel sounds.  No CVAT.  Urine is normal and urine pregnancy test is negative.  Patient obtained vaginal self swab for testing.  Instructed her to abstain from sexual activity until all test results are back.  Discussed that her test results will show in her MyChart account and that we will call if anything is positive requiring treatment.  Instructed her to follow-up with her PCP or gynecologist.  She agrees to plan of care.  Final Clinical Impressions(s) / UC Diagnoses   Final diagnoses:  Vaginal discharge  Malodorous urine  Lower abdominal pain     Discharge Instructions      Your urine is normal.  Urine pregnancy is negative.  Your vaginal tests are pending.  Follow-up with your PCP or gynecologist if your symptoms are not improving.     ED Prescriptions   None    PDMP not reviewed this encounter.   Mickie Bail, NP 07/18/23 1459

## 2023-07-18 NOTE — Discharge Instructions (Addendum)
Your urine is normal.  Urine pregnancy is negative.  Your vaginal tests are pending.  Follow-up with your PCP or gynecologist if your symptoms are not improving.

## 2023-07-20 LAB — CERVICOVAGINAL ANCILLARY ONLY
Bacterial Vaginitis (gardnerella): NEGATIVE
Candida Glabrata: NEGATIVE
Candida Vaginitis: NEGATIVE
Chlamydia: NEGATIVE
Comment: NEGATIVE
Comment: NEGATIVE
Comment: NEGATIVE
Comment: NEGATIVE
Comment: NEGATIVE
Comment: NORMAL
Neisseria Gonorrhea: NEGATIVE
Trichomonas: NEGATIVE

## 2023-12-23 ENCOUNTER — Emergency Department: Payer: Self-pay

## 2023-12-23 ENCOUNTER — Emergency Department
Admission: EM | Admit: 2023-12-23 | Discharge: 2023-12-23 | Disposition: A | Discharge status: Home / Self Care | Payer: Self-pay | Attending: Emergency Medicine | Admitting: Emergency Medicine

## 2023-12-23 ENCOUNTER — Other Ambulatory Visit: Payer: Self-pay

## 2023-12-23 DIAGNOSIS — R600 Localized edema: Secondary | ICD-10-CM | POA: Insufficient documentation

## 2023-12-23 DIAGNOSIS — L03116 Cellulitis of left lower limb: Secondary | ICD-10-CM | POA: Insufficient documentation

## 2023-12-23 LAB — BASIC METABOLIC PANEL WITH GFR
Anion gap: 11 (ref 5–15)
BUN: 10 mg/dL (ref 6–20)
CO2: 23 mmol/L (ref 22–32)
Calcium: 9 mg/dL (ref 8.9–10.3)
Chloride: 105 mmol/L (ref 98–111)
Creatinine, Ser: 1 mg/dL (ref 0.44–1.00)
GFR, Estimated: >60 mL/min (ref >60)
Glucose, Bld: 108 mg/dL — ABNORMAL HIGH (ref 70–99)
Potassium: 3.8 mmol/L (ref 3.5–5.1)
Sodium: 139 mmol/L (ref 135–145)

## 2023-12-23 LAB — CBC
HCT: 41.1 % (ref 36.0–46.0)
Hemoglobin: 13.4 g/dL (ref 12.0–15.0)
MCH: 27.1 pg (ref 26.0–34.0)
MCHC: 32.6 g/dL (ref 30.0–36.0)
MCV: 83 fL (ref 80.0–100.0)
Platelets: 254 10*3/uL (ref 150–400)
RBC: 4.95 MIL/uL (ref 3.87–5.11)
RDW: 13.6 % (ref 11.5–15.5)
WBC: 4.5 10*3/uL (ref 4.0–10.5)
nRBC: 0 % (ref 0.0–0.2)

## 2023-12-23 LAB — POC URINE PREG, ED: Preg Test, Ur: NEGATIVE

## 2023-12-23 LAB — TROPONIN I (HIGH SENSITIVITY): Troponin I (High Sensitivity): 2 ng/L (ref <18)

## 2023-12-23 MED ORDER — CEPHALEXIN 500 MG PO CAPS: 500.0000 mg | ORAL_CAPSULE | Freq: Four times a day (QID) | ORAL | 0 refills | Status: AC

## 2023-12-23 NOTE — ED Triage Notes (Signed)
 Pt to ED [facility] from home for palpitations, fast HR and dizziness since this morning. HR 90-115 per EMS. Pt feeling dizzy. Also had snake bite from "a brown baby snake" on 4/21 to L ankle, which appears swelling. Pt states was checking own HR today and it was 140.  EMS 12 lead normal, EMS VS: 153/86, HR 104, 99% RA.  Pt states wants to make sure no blood clot to ankle. Blue top sent but no d dimer ordered.

## 2023-12-23 NOTE — Discharge Instructions (Signed)
 You were seen for your left leg swelling.  Your laboratory workup was all reassuring today with no overwhelming signs of infection or electrolyte abnormalities.  Ultrasound with no evidence of a blood clot.  At this time, it appears most likely this may be an infection around the skin causing your swelling.  I have sent an antibiotic to your pharmacy for you to take as prescribed.  Please follow-up with your primary care provider in the next several days for reassessment.

## 2023-12-23 NOTE — ED Provider Notes (Signed)
 [facility] Provider Note    Event Date/Time   First MD Initiated Contact with Patient 12/23/23 1653     (approximate)   History   Dizziness and Chest Pain   HPI Carly Bennett is a 43 y.o. female presenting today for leg swelling.  Patient reports she was bit on her left ankle by what she believes was a baby brown snake on 4/21.  About a week after that she started having swelling in her ankle.  It has persisted since then and now noted warmth and pain in the area today.  She had an episode of lightheadedness upon awakening.  Denies any fevers or chills.  No chest pain or shortness of breath.  No prior history of blood clots.  Denies any trauma to her left ankle.  Still able to ambulate on it without significant pain.     Physical Exam   Triage Vital Signs: ED Triage Vitals  Encounter Vitals Group     BP 12/23/23 1507 (!) 158/94     Systolic BP Percentile --      Diastolic BP Percentile --      Pulse Rate 12/23/23 1507 82     Resp 12/23/23 1507 16     Temp 12/23/23 1507 98.8 F (37.1 C)     Temp Source 12/23/23 1507 Oral     SpO2 12/23/23 1507 100 %     Weight 12/23/23 1505 175 lb (79.4 kg)     Height 12/23/23 1505 5\' 4"  (1.626 m)     Head Circumference --      Peak Flow --      Pain Score 12/23/23 1502 0     Pain Loc --      Pain Education --      Exclude from Growth Chart --     Most recent vital signs: Vitals:   12/23/23 1650 12/23/23 1700  BP: 134/82 128/82  Pulse: 79 74  Resp: 17 14  Temp:    SpO2: 100% 100%   I have reviewed the vital signs. General:  Awake, alert, no acute distress. Head:  Normocephalic, Atraumatic. EENT:  PERRL, EOMI, Oral mucosa pink and moist, Neck is supple. Cardiovascular: Regular rate, 2+ distal pulses. Respiratory:  Normal respiratory effort, symmetrical expansion, no distress.   Extremities:  Moving all four extremities through full ROM without pain.   Neuro:  Alert and oriented.  Interacting  appropriately.   Skin: Swelling noted around the left ankle which tracks proximally.  Mild warmth to the area but no erythema.  No significant tenderness palpation. Psych: Appropriate affect.    ED Results / Procedures / Treatments   Labs (all labs ordered are listed, but only abnormal results are displayed) Labs Reviewed  BASIC METABOLIC PANEL WITH GFR - Abnormal; Notable for the following components:      Result Value   Glucose, Bld 108 (*)    All other components within normal limits  CBC  POC URINE PREG, ED  TROPONIN I (HIGH SENSITIVITY)     EKG    RADIOLOGY Independently interpreted ultrasound of left lower extremity with no acute pathology.  Independently interpreted chest x-ray with no acute pathology   PROCEDURES:  Critical Care performed: No  Procedures   MEDICATIONS ORDERED IN ED: Medications - No data to display   IMPRESSION / MDM / ASSESSMENT AND PLAN / ED COURSE  I reviewed the triage vital signs and the nursing notes.  Differential diagnosis includes, but is not limited to, DVT, cellulitis  Patient's presentation is most consistent with acute complicated illness / injury requiring diagnostic workup.  Patient is a 43 year old female presenting today for left ankle swelling and warmth.  Initial injury was from suspected snakebite approximately 1 month ago.  Ankle is swollen with some pitting edema in her left lower extremity and warmth to the area.  Not particularly tender to palpation.  Concern for DVT versus skin infection.  Will get ultrasound for further evaluation.  Laboratory workup with CBC, BMP, and troponin given her lightheadedness was all negative.  Chest x-ray unremarkable.  Vital signs otherwise stable with no concerns for systemic infection.  Ultrasound negative for DVT.  Seems to be more consistent with possible infection around the ankle.  Will discharge on Keflex to see if that provides relief but also  recommended elevation, compression stockings, and ice to the area.  Told to follow-up with PCP within 5 to 7 days and given strict return precautions.     FINAL CLINICAL IMPRESSION(S) / ED DIAGNOSES   Final diagnoses:  Cellulitis of left lower extremity     Rx / DC Orders   ED Discharge Orders          Ordered    cephALEXin (KEFLEX) 500 MG capsule  4 times daily        12/23/23 1854             Note:  This document was prepared using Dragon voice recognition software and may include unintentional dictation errors.   Kandee Orion, MD 12/23/23 [phone]

## 2023-12-27 ENCOUNTER — Emergency Department: Payer: Self-pay

## 2023-12-27 ENCOUNTER — Other Ambulatory Visit: Payer: Self-pay

## 2023-12-27 ENCOUNTER — Emergency Department
Admission: EM | Admit: 2023-12-27 | Discharge: 2023-12-27 | Disposition: A | Discharge status: Home / Self Care | Payer: Self-pay | Attending: Emergency Medicine | Admitting: Emergency Medicine

## 2023-12-27 DIAGNOSIS — R030 Elevated blood-pressure reading, without diagnosis of hypertension: Secondary | ICD-10-CM | POA: Insufficient documentation

## 2023-12-27 DIAGNOSIS — M7989 Other specified soft tissue disorders: Secondary | ICD-10-CM | POA: Insufficient documentation

## 2023-12-27 NOTE — ED Notes (Signed)
 Ultrasound in progress at bedside.

## 2023-12-27 NOTE — Discharge Instructions (Addendum)
 Your ultrasound was normal in the ED today.  There are no blood clots.  It Is important to follow-up with your primary care provider to discuss the workups that were performed in the emergency department over this week to possibly discuss medication to manage your blood pressure.

## 2023-12-27 NOTE — ED Triage Notes (Addendum)
 To ER with report of ongoing high blood pressure since getting bit by a snake on 11/23/23. Since then intermittent high blood pressure and leg swelling. Seen on Wednesday for leg swelling and placed on antibiotics. Also was hypertensive at that visit.

## 2023-12-27 NOTE — ED Provider Notes (Signed)
 [facility] Emergency Department Provider Note     Event Date/Time   First MD Initiated Contact with Patient 12/27/23 2020     (approximate)   History   Hypertension   HPI  Carly Bennett is a 43 y.o. female with no significant past medical history presents to the ED for concern of intermittent elevated blood pressure readings following a possible brown snake bite on 04/21.  Patient was evaluated in this ED 4 days ago and workup was reassuring.  Patient reports no pain and states improvement in her left leg swelling but still has concerns that there could be a clot.  Denies history of blood clots. She reports she is taking Keflex and has 3 to 4 days remaining on this medication.  She does report during these times of blood pressure she feels "off" and has spells of dizziness. She denies chest pain, shortness of breath, blurred vision, and confusion     Physical Exam   Triage Vital Signs: ED Triage Vitals [12/27/23 1959]  Encounter Vitals Group     BP (!) 139/91     Systolic BP Percentile      Diastolic BP Percentile      Pulse Rate 92     Resp 16     Temp 98.5 F (36.9 C)     Temp Source Oral     SpO2 99 %     Weight 175 lb 0.7 oz (79.4 kg)     Height 5\' 4"  (1.626 m)     Head Circumference      Peak Flow      Pain Score 3     Pain Loc      Pain Education      Exclude from Growth Chart     Most recent vital signs: Vitals:   12/27/23 1959 12/27/23 2228  BP: (!) 139/91 (!) 143/95  Pulse: 92 83  Resp: 16 16  Temp: 98.5 F (36.9 C) 98.5 F (36.9 C)  SpO2: 99% 100%    General: Well appearing. Alert and oriented. INAD.  Skin:  Warm, dry and intact. No rashes or lesions noted.     Head:  NCAT.  Eyes:  PERRLA. EOMI.  CV:  Good peripheral perfusion. RRR. No peripheral edema.  RESP:  Normal effort. LCTAB.  ABD:  No distention. Soft, Non tender. NEURO: Cranial nerves II-XII intact. No focal deficits. Sensation and motor function  intact. 5/5 muscle strength of UE & LE. Gait is steady.   ED Results / Procedures / Treatments   Labs (all labs ordered are listed, but only abnormal results are displayed) Labs Reviewed - No data to display  US  Venous Img Lower  Left (DVT Study) Result Date: 12/27/2023 CLINICAL DATA:  Snake bite and swelling. EXAM: LEFT LOWER EXTREMITY VENOUS DOPPLER ULTRASOUND TECHNIQUE: Gray-scale sonography with compression, as well as color and duplex ultrasound, were performed to evaluate the deep venous system(s) from the level of the common femoral vein through the popliteal and proximal calf veins. COMPARISON:  12/23/2023. FINDINGS: VENOUS Normal compressibility of the common femoral, superficial femoral, and popliteal veins, as well as the visualized calf veins. Visualized portions of profunda femoral vein and great saphenous vein unremarkable. No filling defects to suggest DVT on grayscale or color Doppler imaging. Doppler waveforms show normal direction of venous flow, normal respiratory plasticity and response to augmentation. Limited views of the contralateral common femoral vein are unremarkable. OTHER None. Limitations: none IMPRESSION: Negative. Electronically Signed  By: Sydell Eva M.D.   On: 12/27/2023 22:01    PROCEDURES:  Critical Care performed: No  Procedures   MEDICATIONS ORDERED IN ED: Medications - No data to display  IMPRESSION / MDM / ASSESSMENT AND PLAN / ED COURSE  I reviewed the triage vital signs and the nursing notes.                               43 y.o. female presents to the emergency department for evaluation and treatment of asymptomatic hypertension. See HPI for further details.   Differential diagnosis includes, but is not limited to DVT, cellulitis, lymphedema, anxiety   Patient's presentation is most consistent with acute complicated illness / injury requiring diagnostic workup.  Patient is alert and oriented.  Her blood pressure is 139/91.  This is  elevated from her baseline however I do not believe initiating antihypertensive medication is in the best management of this patient.  Patient is asymptomatic.  She does have concerns of a possible blood clot in her leg and with shared decision making we decided to repeat ultrasound for reassurance of negative DVT.  The ultrasound performed did not reveal a blood clot and this was provided for the patient.  Patient is stable condition for discharge home.  Encouraged to follow-up with primary care provider in 3 days.  ED return precautions discussed.  FINAL CLINICAL IMPRESSION(S) / ED DIAGNOSES   Final diagnoses:  Transient elevated blood pressure  Left leg swelling     Rx / DC Orders   ED Discharge Orders     None      Note:  This document was prepared using Dragon voice recognition software and may include unintentional dictation errors.    Phyllis Breeze, Shandiin Eisenbeis A, PA-C 12/27/23 2236    Bryson Carbine, MD 12/27/23 [phone]

## 2024-08-08 ENCOUNTER — Telehealth: Payer: Self-pay

## 2024-08-08 NOTE — Telephone Encounter (Signed)
 Copied from CRM #[id number]. Topic: Appointments - Transfer of Care >> Aug 08, 2024  2:41 PM Antwanette L wrote: Pt is requesting to transfer FROM: Michelene Cower PA-C Pt is requesting to transfer TO: Mliss Spray FNP Reason for requested transfer: Michelene Cower is no longer at the office It is the responsibility of the team the patient would like to transfer to Ilah Spray FNP) to reach out to the patient if for any reason this transfer is not acceptable.

## 2024-09-07 ENCOUNTER — Ambulatory Visit

## 2024-09-07 ENCOUNTER — Other Ambulatory Visit (HOSPITAL_COMMUNITY): Admission: RE | Admit: 2024-09-07 | Discharge: 2024-09-07 | Disposition: A | Source: Ambulatory Visit

## 2024-09-07 VITALS — BP 110/66 | HR 82 | Ht 64.0 in | Wt 180.4 lb

## 2024-09-07 DIAGNOSIS — N898 Other specified noninflammatory disorders of vagina: Secondary | ICD-10-CM

## 2024-09-07 NOTE — Progress Notes (Cosign Needed)
" ° ° °  NURSE VISIT NOTE  Subjective:    Patient ID: Carly Bennett, female    DOB: 1980/11/16, 44 y.o.   MRN: [id number]  HPI  Patient is a 44 y.o. [id number] female who presents for malodorous vaginal discharge for 2 week(s). Denies abnormal vaginal bleeding or significant pelvic pain or fever. denies dysuria and pelvic pain. Patient admits to a history of known exposure to STD.   Objective:    Ht 5' 4 (1.626 m)   Wt 180 lb 6.4 oz (81.8 kg)   LMP 08/29/2024 (Exact Date)   BMI 30.97 kg/m    No results found for any visits on 09/07/24.  Assessment:   1. Vaginal irritation     nonspecific vaginitis  Plan:   GC and chlamydia DNA  probe sent to lab. ROV prn if symptoms persist or worsen.   Mathis LITTIE Getting, CMA  "

## 2024-09-09 ENCOUNTER — Ambulatory Visit: Payer: Self-pay

## 2024-09-09 LAB — CERVICOVAGINAL ANCILLARY ONLY
Bacterial Vaginitis (gardnerella): NEGATIVE
Candida Glabrata: NEGATIVE
Candida Vaginitis: NEGATIVE
Chlamydia: NEGATIVE
Comment: NEGATIVE
Comment: NEGATIVE
Comment: NEGATIVE
Comment: NEGATIVE
Comment: NEGATIVE
Comment: NORMAL
Neisseria Gonorrhea: NEGATIVE
Trichomonas: NEGATIVE

## 2024-09-15 ENCOUNTER — Ambulatory Visit: Payer: Self-pay | Admitting: Certified Nurse Midwife

## 2024-09-23 ENCOUNTER — Encounter: Payer: Self-pay | Admitting: Nurse Practitioner
